# Patient Record
Sex: Male | Born: 1990 | Race: White | Hispanic: No | Marital: Married | Smoking: Never smoker
Health system: Southern US, Community
[De-identification: ages and names within clinical notes are randomized; demographics above are authoritative.]

## PROBLEM LIST (undated history)

## (undated) DIAGNOSIS — T7840XA Allergy, unspecified, initial encounter: Secondary | ICD-10-CM

## (undated) DIAGNOSIS — E785 Hyperlipidemia, unspecified: Secondary | ICD-10-CM

## (undated) HISTORY — DX: Hyperlipidemia, unspecified: E78.5

## (undated) HISTORY — DX: Allergy, unspecified, initial encounter: T78.40XA

## (undated) HISTORY — PX: WISDOM TOOTH EXTRACTION: SHX21

---

## 2012-12-08 HISTORY — PX: FUNCTIONAL ENDOSCOPIC SINUS SURGERY: SUR616

## 2017-09-28 ENCOUNTER — Encounter: Payer: Self-pay | Admitting: Family Medicine

## 2017-09-28 ENCOUNTER — Ambulatory Visit (INDEPENDENT_AMBULATORY_CARE_PROVIDER_SITE_OTHER): Payer: Managed Care, Other (non HMO) | Admitting: Family Medicine

## 2017-09-28 VITALS — BP 122/76 | HR 62 | Temp 98.7°F | Resp 12 | Ht 70.47 in | Wt 200.4 lb

## 2017-09-28 DIAGNOSIS — J309 Allergic rhinitis, unspecified: Secondary | ICD-10-CM

## 2017-09-28 DIAGNOSIS — R0683 Snoring: Secondary | ICD-10-CM

## 2017-09-28 DIAGNOSIS — Z23 Encounter for immunization: Secondary | ICD-10-CM | POA: Diagnosis not present

## 2017-09-28 DIAGNOSIS — R5382 Chronic fatigue, unspecified: Secondary | ICD-10-CM | POA: Insufficient documentation

## 2017-09-28 DIAGNOSIS — Z114 Encounter for screening for human immunodeficiency virus [HIV]: Secondary | ICD-10-CM | POA: Diagnosis not present

## 2017-09-28 LAB — COMPREHENSIVE METABOLIC PANEL
ALT: 22 U/L (ref 0–53)
AST: 18 U/L (ref 0–37)
Albumin: 4.8 g/dL (ref 3.5–5.2)
Alkaline Phosphatase: 41 U/L (ref 39–117)
BUN: 12 mg/dL (ref 6–23)
CHLORIDE: 103 meq/L (ref 96–112)
CO2: 29 mEq/L (ref 19–32)
Calcium: 10.1 mg/dL (ref 8.4–10.5)
Creatinine, Ser: 0.87 mg/dL (ref 0.40–1.50)
GFR: 111.1 mL/min (ref >60.00)
GLUCOSE: 90 mg/dL (ref 70–99)
POTASSIUM: 4 meq/L (ref 3.5–5.1)
SODIUM: 141 meq/L (ref 135–145)
Total Bilirubin: 0.6 mg/dL (ref 0.2–1.2)
Total Protein: 7.1 g/dL (ref 6.0–8.3)

## 2017-09-28 LAB — CBC
HEMATOCRIT: 47.1 % (ref 39.0–52.0)
HEMOGLOBIN: 16.1 g/dL (ref 13.0–17.0)
MCHC: 34.2 g/dL (ref 30.0–36.0)
MCV: 90.3 fl (ref 78.0–100.0)
PLATELETS: 193 10*3/uL (ref 150.0–400.0)
RBC: 5.21 Mil/uL (ref 4.22–5.81)
RDW: 12.8 % (ref 11.5–15.5)
WBC: 5.9 10*3/uL (ref 4.0–10.5)

## 2017-09-28 LAB — TSH: TSH: 1.09 u[IU]/mL (ref 0.35–4.50)

## 2017-09-28 LAB — VITAMIN D 25 HYDROXY (VIT D DEFICIENCY, FRACTURES): VITD: 27.66 ng/mL — ABNORMAL LOW (ref 30.00–100.00)

## 2017-09-28 MED ORDER — FLUTICASONE PROPIONATE 50 MCG/ACT NA SUSP
1.0000 | Freq: Two times a day (BID) | NASAL | 3 refills | Status: DC
Start: 2017-09-28 — End: 2018-01-08

## 2017-09-28 NOTE — Patient Instructions (Addendum)
A few things to remember from today's visit:   Encounter for screening for HIV - Plan: HIV antibody, Ambulatory referral to Pulmonology  Chronic fatigue - Plan: Comprehensive metabolic panel, HIV antibody, CBC, TSH, VITAMIN D 25 Hydroxy (Vit-D Deficiency, Fractures)  Snoring - Plan: Ambulatory referral to Pulmonology  Over the counter Allegra 180 mg daily.  Nasal strips at night may help with snoring. It is a common symptom associated with multiple factors: psychologic,medications, systemic illness, sleep disorders,infections, and unknown causes. Some work-up can be done to evaluate for common causes as thyroid disease,anemia,diabetes, or abnormalities in calcium,potassium,or sodium. Regular physical activity as tolerated and a healthy diet is usually might help and usually recommended for chronic fatigue.  We have ordered labs or studies at this visit.  It can take up to 1-2 weeks for results and processing. IF results require follow up or explanation, we will call you with instructions. Clinically stable results will be released to your [facility]. If you have not heard from us or cannot find your results in [facility] in 2 weeks please contact our office at [phone].  If you are not yet signed up for [facility], please consider signing up  Please be sure medication list is accurate. If a new problem present, please set up appointment sooner than planned today.

## 2017-09-28 NOTE — Progress Notes (Signed)
HPI:   Gabriel Warner is a 26 y.o. male, who is here today to establish care.  Former PCP: N/A. [place] Last preventive routine visit: A few years ago.  Chronic medical problems: Allergic rhinitis   Concerns today: Sleep apnea  For at least 4 years he has had fatigue, most of the time he doe snot feel rested when he wakes up in the morning. His wife has noted louder snoring and he is concerned about OSA. Not sure about apneic episodes. He has not identified exacerbating or alleviating factors, he feels tired every day. He denies falling asleep while driving or during meetings.   Sleeps about 7 hours.  He is also reporting problems with concentration and getting easily distracted, both she attributes to possible sleep apnea.this is not affecting his job performance, although he feels like he should be able to be more efficient if he slept better.  He denies any history of psychiatric disorder, denies anxiety or depression symptoms. History of allergic rhinitis, nasal congestion intermittently during the day. It used to be exacerbated by cats but it does not seem to be as bad as he was before. Currently he is not on treatment for allergies. He follows a healthy diet and exercises regularly.  He denies tobacco or illicit drug use, or alcohol abuse. He drinks alcohol just during weekends, about 6 beers per night and/or 3 shots of vodka.He is not specific about amount.   Review of Systems  Constitutional: Positive for fatigue. Negative for activity change, appetite change, fever and unexpected weight change.  HENT: Positive for congestion. Negative for facial swelling, mouth sores, nosebleeds, sinus pain, sore throat, trouble swallowing and voice change.   Eyes: Negative for redness and visual disturbance.  Respiratory: Negative for cough, shortness of breath and wheezing.   Cardiovascular: Negative for chest pain, palpitations and leg  swelling.  Gastrointestinal: Negative for abdominal pain, nausea and vomiting.       No changes in bowel habits.  Endocrine: Negative for cold intolerance and heat intolerance.  Genitourinary: Negative for decreased urine volume, dysuria and hematuria.  Musculoskeletal: Negative for back pain, joint swelling and myalgias.  Skin: Negative for pallor and rash.  Allergic/Immunologic: Positive for environmental allergies.  Neurological: Negative for dizziness, syncope, weakness and headaches.  Hematological: Negative for adenopathy. Does not bruise/bleed easily.  Psychiatric/Behavioral: Negative for confusion and sleep disturbance. The patient is not nervous/anxious.     No current outpatient prescriptions on file prior to visit.   No current facility-administered medications on file prior to visit.      Past Medical History:  Diagnosis Date  . Allergy    No Known Allergies  Family History  Problem Relation Age of Onset  . Hypertension Mother     Social History   Social History  . Marital status: Married    Spouse name: N/A  . Number of children: N/A  . Years of education: N/A   Social History Main Topics  . Smoking status: Never Smoker  . Smokeless tobacco: Never Used  . Alcohol use No  . Drug use: No  . Sexual activity: Yes    Partners: Female   Other Topics Concern  . None   Social History Narrative  . None    Vitals:   09/28/17 0900  BP: 122/76  Pulse: 62  Resp: 12  Temp: 98.7 F (37.1 C)  SpO2: 98%    Body mass index is 28.37  kg/m.    Physical Exam  Nursing note and vitals reviewed. Constitutional: He is oriented to person, place, and time. He appears well-developed. No distress.  HENT:  Head: Normocephalic and atraumatic.  Mouth/Throat: Oropharynx is clear and moist and mucous membranes are normal.  Mildly hypertrophic turbinates.   Eyes: Pupils are equal, round, and reactive to light. Conjunctivae and EOM are normal.  Neck: No tracheal  deviation present. No thyroid mass and no thyromegaly present.  Cardiovascular: Normal rate and regular rhythm.   No murmur heard. Pulses:      Dorsalis pedis pulses are 2+ on the right side, and 2+ on the left side.  Respiratory: Effort normal and breath sounds normal. No respiratory distress.  GI: Soft. He exhibits no mass. There is no hepatomegaly. There is no tenderness.  Musculoskeletal: He exhibits no edema or tenderness.  Lymphadenopathy:    He has no cervical adenopathy.       Right: No supraclavicular adenopathy present.       Left: No supraclavicular adenopathy present.  Neurological: He is alert and oriented to person, place, and time. He has normal strength. Coordination and gait normal.  Skin: Skin is warm. No rash noted. No erythema.  Psychiatric: His mood appears anxious. Cognition and memory are normal.  Well groomed, good eye contact.      ASSESSMENT AND PLAN:   [name] was seen today for establish care.  Diagnoses and all orders for this visit:  Chronic fatigue  We discussed possible etiologies: Systemic illness, immunologic,endocrinology,sleep disorder, psychiatric/psychologic, infectious,medications side effects, and idiopathic. Examination today does not suggest a serious process. Healthy diet and regular physical activity to continue. Recommend decreasing alcohol intake. Further recommendations will be given according to lab results.  -     Comprehensive metabolic panel -     HIV antibody -     CBC -     TSH -     VITAMIN D 25 Hydroxy (Vit-D Deficiency, Fractures)  Encounter for screening for HIV -     HIV antibody -     Ambulatory referral to Pulmonology  Snoring  Possible causes of louder snoring discussed. Explained that snoring is not always abnormal. Nasal strips and position changes may help.  -     Ambulatory referral to Pulmonology  Allergic rhinitis, unspecified seasonality, unspecified trigger  This could be contributing to  fatigue and snoring. Recommend trying OTC antihistaminic. Flonase nasal spray may also help. F/U as needed.  -     fluticasone (FLONASE) 50 MCG/ACT nasal spray; Place 1 spray into both nostrils 2 (two) times daily.  Need for influenza vaccination -     Flu Vaccine QUAD 36+ mos IM     Gabriel Diekman G. [place], MD  [facility]. [place] [facility].

## 2017-09-29 LAB — HIV ANTIBODY (ROUTINE TESTING W REFLEX): HIV: NONREACTIVE

## 2017-11-09 ENCOUNTER — Institutional Professional Consult (permissible substitution): Payer: Managed Care, Other (non HMO) | Admitting: Pulmonary Disease

## 2017-12-18 ENCOUNTER — Institutional Professional Consult (permissible substitution): Payer: Managed Care, Other (non HMO) | Admitting: Pulmonary Disease

## 2018-01-08 ENCOUNTER — Encounter: Payer: Self-pay | Admitting: Internal Medicine

## 2018-01-08 ENCOUNTER — Ambulatory Visit (INDEPENDENT_AMBULATORY_CARE_PROVIDER_SITE_OTHER): Payer: Managed Care, Other (non HMO) | Admitting: Internal Medicine

## 2018-01-08 VITALS — BP 122/80 | HR 57 | Ht 69.0 in | Wt 200.0 lb

## 2018-01-08 DIAGNOSIS — R5382 Chronic fatigue, unspecified: Secondary | ICD-10-CM | POA: Diagnosis not present

## 2018-01-08 DIAGNOSIS — G4733 Obstructive sleep apnea (adult) (pediatric): Secondary | ICD-10-CM | POA: Diagnosis not present

## 2018-01-08 NOTE — Progress Notes (Signed)
01[birth year]18-27 year old male never smoker for sleep evaluation. Medical problem list includes allergic rhinitis, chronic fatigue Epworth score 8 Father has OSA/CPAP. Told he snores.  Persistent waking unrefreshed with daytime tiredness.  3 cups of coffee through the day.  Occasional melatonin.  Not aware of movement parasomnias.  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  Not aware of heart or lung problems.  Previous endoscopic ENT sinus surgery. He did not do well with an unfettered oral appliance P purchased on his own.  Prior to Admission medications   Medication Sig Start Date End Date Taking? Authorizing Provider  CALCIUM PO Take by mouth. Take 1 tablet 3 times a week   Yes [provider]  Cholecalciferol (VITAMIN D) 2000 units CAPS Take 1 capsule by mouth daily.   Yes [provider]  Omega-3 Fatty Acids (FISH OIL PO) Take by mouth. 1 tab 3 times a week   Yes [provider]   Past Medical History:  Diagnosis Date  . Allergy     Family History  Problem Relation Age of Onset  . Hypertension Mother    Social History   Socioeconomic History  . Marital status: Married    Spouse name: Not on file  . Number of children: Not on file  . Years of education: Not on file  . Highest education level: Not on file  Social Needs  . Financial resource strain: Not on file  . Food insecurity - worry: Not on file  . Food insecurity - inability: Not on file  . Transportation needs - medical: Not on file  . Transportation needs - non-medical: Not on file  Occupational History  . Not on file  Tobacco Use  . Smoking status: Never Smoker  . Smokeless tobacco: Never Used  Substance and Sexual Activity  . Alcohol use: No    Comment: Social-mix of beer,wine, or liquor  . Drug use: No  . Sexual activity: Yes    Partners: Female  Other Topics Concern  . Not on file  Social History Narrative  . Not on file   ROS-see HPI   + = positive Constitutional:    weight loss,  night sweats, fevers, chills, fatigue, lassitude. HEENT:    headaches, difficulty swallowing, tooth/dental problems, sore throat,       sneezing, itching, ear ache, nasal congestion, post nasal drip, snoring CV:    chest pain, orthopnea, PND, swelling in lower extremities, anasarca,                                                     dizziness, palpitations Resp:   shortness of breath with exertion or at rest.                productive cough,   non-productive cough, coughing up of blood.              change in color of mucus.  wheezing.   Skin:    rash or lesions. GI:  No-   heartburn, indigestion, abdominal pain, nausea, vomiting, diarrhea,                 change in bowel habits, loss of appetite GU: dysuria, change in color of urine, no urgency or frequency.   flank pain. MS:   joint pain, stiffness, decreased range of motion, back pain. Neuro-  nothing unusual Psych:  change in mood or affect.  depression or anxiety.   memory loss.  OBJ- Physical Exam General- Alert, Oriented, Affect-appropriate, Distress- none acute, + medium build Skin- rash-none, lesions- none, excoriation- none Lymphadenopathy- none Head- atraumatic            Eyes- Gross vision intact, PERRLA, conjunctivae and secretions clear            Ears- Hearing, canals-normal            Nose- Clear, no-Septal dev, mucus, polyps, erosion, perforation             Throat- Mallampati II , mucosa clear , drainage- none, tonsils-absent, + own teeth Neck- flexible , trachea midline, no stridor , thyroid nl, carotid no bruit Chest - symmetrical excursion , unlabored           Heart/CV- RRR , no murmur , no gallop  , no rub, nl s1 s2                           - JVD- none , edema- none, stasis changes- none, varices- none           Lung- clear to P&A, wheeze- none, cough- none , dullness-none, rub- none           Chest wall-  Abd-  Br/ Gen/ Rectal- Not done, not indicated Extrem- cyanosis- none, clubbing, none, atrophy- none,  strength- nl Neuro- grossly intact to observation

## 2018-01-08 NOTE — Assessment & Plan Note (Signed)
Suspect the problem may be from obstructive sleep apnea.  Basic sleep hygiene and the mechanics of obstructive sleep apnea were discussed. Plan-schedule sleep study.  He will call for results.  We had talked about CPAP and oral appliances as treatment possibilities.

## 2018-01-08 NOTE — Patient Instructions (Signed)
Order- please schedule unattended home sleep test   Dx OSA   Please call me about 2 weeks after your sleep test to ask for results and recommendations. If appropriate, we may be able to start treatment before I see you next.

## 2018-01-15 ENCOUNTER — Institutional Professional Consult (permissible substitution): Payer: Managed Care, Other (non HMO) | Admitting: Pulmonary Disease

## 2018-01-31 DIAGNOSIS — G4733 Obstructive sleep apnea (adult) (pediatric): Secondary | ICD-10-CM | POA: Diagnosis not present

## 2018-02-01 DIAGNOSIS — G4733 Obstructive sleep apnea (adult) (pediatric): Secondary | ICD-10-CM | POA: Diagnosis not present

## 2018-02-02 ENCOUNTER — Other Ambulatory Visit: Payer: Self-pay | Admitting: *Deleted

## 2018-02-02 DIAGNOSIS — G4733 Obstructive sleep apnea (adult) (pediatric): Secondary | ICD-10-CM

## 2018-03-09 ENCOUNTER — Telehealth: Payer: Self-pay | Admitting: Internal Medicine

## 2018-03-09 NOTE — Telephone Encounter (Signed)
Called and spoke with patient. He states he was returning call from sleep results. Advised patient of the results. Per result note patient could keep or cancel his appointment. Patient has chosen to cancel it. Nothing further needed at this time.

## 2018-04-09 ENCOUNTER — Ambulatory Visit: Payer: Managed Care, Other (non HMO) | Admitting: Internal Medicine

## 2018-05-31 ENCOUNTER — Telehealth: Payer: Self-pay | Admitting: Internal Medicine

## 2018-05-31 DIAGNOSIS — G4733 Obstructive sleep apnea (adult) (pediatric): Secondary | ICD-10-CM

## 2018-05-31 DIAGNOSIS — R5382 Chronic fatigue, unspecified: Secondary | ICD-10-CM

## 2018-05-31 NOTE — Telephone Encounter (Signed)
Called patient, unable to reach. Unable to leave voice mail due to box being full.

## 2018-06-02 NOTE — Telephone Encounter (Signed)
Spoke with pt, he states he did a HST and it showed that he did not have sleep apnea but he still wanted the device. CY please advise on referral to a dentist to fit for an oral device/mouth guard?

## 2018-06-04 NOTE — Telephone Encounter (Signed)
Returned call to Patient.  Explained that CY is out of the office, but he will get his message Monday 06/07/18,and someone will return his call, with CY recommendations.

## 2018-06-04 NOTE — Telephone Encounter (Signed)
ATC patient to let him know that CY is out of the office until 06/07/18; however unable to leave a message at this time-mailbox is full

## 2018-06-07 NOTE — Telephone Encounter (Signed)
Ok to refer to Dr Althea GrimmerMark Katz, DDS orthodontist to consider oral appliance to treat snoring and fatigue.

## 2018-06-07 NOTE — Telephone Encounter (Signed)
Referral has been placed and pt has been made aware. Nothing further needed.

## 2018-09-27 ENCOUNTER — Encounter: Payer: Self-pay | Admitting: Family Medicine

## 2018-09-27 ENCOUNTER — Ambulatory Visit (INDEPENDENT_AMBULATORY_CARE_PROVIDER_SITE_OTHER): Payer: Managed Care, Other (non HMO) | Admitting: Family Medicine

## 2018-09-27 VITALS — BP 110/70 | HR 74 | Temp 98.7°F | Resp 12 | Ht 70.0 in | Wt 212.0 lb

## 2018-09-27 DIAGNOSIS — Z0189 Encounter for other specified special examinations: Secondary | ICD-10-CM | POA: Diagnosis not present

## 2018-09-27 DIAGNOSIS — Z131 Encounter for screening for diabetes mellitus: Secondary | ICD-10-CM

## 2018-09-27 DIAGNOSIS — Z0000 Encounter for general adult medical examination without abnormal findings: Secondary | ICD-10-CM

## 2018-09-27 DIAGNOSIS — Z13220 Encounter for screening for lipoid disorders: Secondary | ICD-10-CM

## 2018-09-27 DIAGNOSIS — Z008 Encounter for other general examination: Secondary | ICD-10-CM

## 2018-09-27 LAB — LIPID PANEL
Cholesterol: 245 mg/dL — ABNORMAL HIGH (ref 0–200)
HDL: 49.8 mg/dL (ref >39.00)
NONHDL: 194.75
TRIGLYCERIDES: 291 mg/dL — AB (ref 0.0–149.0)
Total CHOL/HDL Ratio: 5
VLDL: 58.2 mg/dL — ABNORMAL HIGH (ref 0.0–40.0)

## 2018-09-27 LAB — BASIC METABOLIC PANEL
BUN: 16 mg/dL (ref 6–23)
CALCIUM: 9.7 mg/dL (ref 8.4–10.5)
CO2: 30 meq/L (ref 19–32)
Chloride: 101 mEq/L (ref 96–112)
Creatinine, Ser: 1.03 mg/dL (ref 0.40–1.50)
GFR: 90.79 mL/min (ref >60.00)
Glucose, Bld: 100 mg/dL — ABNORMAL HIGH (ref 70–99)
Potassium: 3.9 mEq/L (ref 3.5–5.1)
SODIUM: 140 meq/L (ref 135–145)

## 2018-09-27 LAB — LDL CHOLESTEROL, DIRECT: Direct LDL: 162 mg/dL

## 2018-09-27 NOTE — Patient Instructions (Addendum)
A few things to remember from today's visit:   Routine general medical examination at a health care facility  Screening for lipid disorders - Plan: Lipid panel  Diabetes mellitus screening - Plan: Basic metabolic panel  Encounter for biometric screening - Plan: Basic metabolic panel, Lipid panel  At least 150 minutes of moderate exercise per week, daily brisk walking for 15-30 min is a good exercise option. Healthy diet low in saturated (animal) fats and sweets and consisting of fresh fruits and vegetables, lean meats such as fish and white chicken and whole grains.  - Vaccines:  Tdap vaccine every 10 years.  Shingles vaccine recommended at age 8, could be given after 29 years of age but not sure about insurance coverage.  Pneumonia vaccines:  Prevnar 13 at 65 and Pneumovax at 57.   -Screening recommendations for low/normal risk males:  Screening for diabetes at age 35 and every 3 years. Earlier screening if cardiovascular risk factors.   Lipid screening at 35 and every 3 years. Screening starts in younger males with cardiovascular risk factors.  Colon cancer screening at age 71 and until age 67.  Prostate cancer screening: some controversy, starts usually at 50: Rectal exam and PSA.  Aortic Abdominal Aneurism once between 67 and 65 years old if ever smoker.  Also recommended:  1. Dental visit- Brush and floss your teeth twice daily; visit your dentist twice a year. 2. Eye doctor- Get an eye exam at least every 2 years. 3. Helmet use- Always wear a helmet when riding a bicycle, motorcycle, rollerblading or skateboarding. 4. Safe sex- If you may be exposed to sexually transmitted infections, use a condom. 5. Seat belts- Seat belts can save your live; always wear one. 6. Smoke/Carbon Monoxide detectors- These detectors need to be installed on the appropriate level of your home. Replace batteries at least once a year. 7. Skin cancer- When out in the sun please cover up and  use sunscreen 15 SPF or higher. 8. Violence- If anyone is threatening or hurting you, please tell your healthcare provider.  9. Drink alcohol in moderation- Limit alcohol intake to one drink or less per day. Never drink and drive.   Please be sure medication list is accurate. If a new problem present, please set up appointment sooner than planned today.

## 2018-09-27 NOTE — Progress Notes (Signed)
HPI:  [name] is a 27 y.o.male here today for his routine physical examination.  Last CPE: Many years ago. He lives with his wife, who is expecting their first child.  She is due in 2 weeks.  Regular exercise 3 or more times per week: He runs about 3 times per week. Following a healthy diet: Yes   Chronic medical problems: Fatigue. Sleep study was negative for OSA. He was evaluated by orthodontist but cannot afford mild device to help with sleep.  Hx of STD's: Negative  Immunization History  Administered Date(s) Administered  . Influenza,inj,Quad PF,6+ Mos 09/28/2017  . Influenza-Unspecified 09/13/2018  . Tdap 07/09/2018    -Denies high alcohol intake, tobacco use, or Hx of illicit drug use. He thinks he needs to have biometric exam done in order to get gift certificate from his insurance company.  Concerns today: Left testicular "bump." About a month ago he noted a small lesion on his left scrotum. He wants to know if it is a wart or something else he should be worried about.  He is not sure if lesion is growing, it is not tender. He has not tried OTC treatments.  No history of trauma. He has not noted dysuria, hematuria, or urine frequency. No urethral discharge.  Review of Systems  Constitutional: Positive for fatigue. Negative for activity change, appetite change, fever and unexpected weight change.  HENT: Negative for dental problem, nosebleeds, sore throat, trouble swallowing and voice change.   Eyes: Negative for redness and visual disturbance.  Respiratory: Negative for cough, shortness of breath and wheezing.   Cardiovascular: Negative for chest pain, palpitations and leg swelling.  Gastrointestinal: Negative for abdominal pain, blood in stool, nausea and vomiting.       No changes in bowel habits.  Endocrine: Negative for cold intolerance, heat intolerance, polydipsia, polyphagia and polyuria.  Genitourinary: Negative for decreased urine  volume, dysuria, genital sores, hematuria and testicular pain.  Musculoskeletal: Negative for arthralgias, back pain, joint swelling and myalgias.  Skin: Negative for color change and rash.  Allergic/Immunologic: Positive for environmental allergies.  Neurological: Negative for dizziness, syncope, weakness and headaches.  Hematological: Negative for adenopathy. Does not bruise/bleed easily.  Psychiatric/Behavioral: Negative for confusion and sleep disturbance. The patient is nervous/anxious.      Current Outpatient Medications on File Prior to Visit  Medication Sig Dispense Refill  . CALCIUM PO Take by mouth. Take 1 tablet 3 times a week    . Cholecalciferol (VITAMIN D) 2000 units CAPS Take 1 capsule by mouth daily.    . Omega-3 Fatty Acids (FISH OIL PO) Take by mouth. 1 tab 3 times a week     No current facility-administered medications on file prior to visit.      Past Medical History:  Diagnosis Date  . Allergy     Past Surgical History:  Procedure Laterality Date  . FUNCTIONAL ENDOSCOPIC SINUS SURGERY  2014    No Known Allergies  Family History  Problem Relation Age of Onset  . Hypertension Mother     Social History   Socioeconomic History  . Marital status: Married    Spouse name: Not on file  . Number of children: Not on file  . Years of education: Not on file  . Highest education level: Not on file  Occupational History  . Not on file  Social Needs  . Financial resource strain: Not on file  . Food insecurity:    Worry: Not on  file    Inability: Not on file  . Transportation needs:    Medical: Not on file    Non-medical: Not on file  Tobacco Use  . Smoking status: Never Smoker  . Smokeless tobacco: Never Used  Substance and Sexual Activity  . Alcohol use: No    Comment: Social-mix of beer,wine, or liquor  . Drug use: No  . Sexual activity: Yes    Partners: Female  Lifestyle  . Physical activity:    Days per week: Not on file    Minutes per  session: Not on file  . Stress: Not on file  Relationships  . Social connections:    Talks on phone: Not on file    Gets together: Not on file    Attends religious service: Not on file    Active member of club or organization: Not on file    Attends meetings of clubs or organizations: Not on file    Relationship status: Not on file  Other Topics Concern  . Not on file  Social History Narrative  . Not on file     Vitals:   09/27/18 0834  BP: 110/70  Pulse: 74  Resp: 12  Temp: 98.7 F (37.1 C)  SpO2: 98%   Body mass index is 30.42 kg/m.   Wt Readings from Last 3 Encounters:  09/27/18 212 lb (96.2 kg)  01/08/18 200 lb (90.7 kg)  09/28/17 200 lb 6 oz (90.9 kg)    Physical Exam  Nursing note and vitals reviewed. Constitutional: He is oriented to person, place, and time. He appears well-developed. No distress.  HENT:  Head: Atraumatic.  Right Ear: Tympanic membrane, external ear and ear canal normal.  Left Ear: Tympanic membrane, external ear and ear canal normal.  Mouth/Throat: Oropharynx is clear and moist and mucous membranes are normal.  Eyes: Pupils are equal, round, and reactive to light. Conjunctivae and EOM are normal.  Neck: Normal range of motion. No tracheal deviation present. No thyromegaly present.  Cardiovascular: Normal rate and regular rhythm.  No murmur heard. Pulses:      Dorsalis pedis pulses are 2+ on the right side, and 2+ on the left side.  Respiratory: Effort normal and breath sounds normal. No respiratory distress.  GI: Soft. He exhibits no mass. There is no hepatomegaly. There is no tenderness. Hernia confirmed negative in the right inguinal area and confirmed negative in the left inguinal area.  Genitourinary: Right testis shows no mass, no swelling and no tenderness. Left testis shows no mass, no swelling and no tenderness.  Genitourinary Comments: Area of concern on posterior aspect of left scrotum: 1 mm whitish race skin lesion. It is not  tender or erythematous. Define borders,smooth. It is superficial and mobile.  Musculoskeletal: He exhibits no edema or tenderness.  No major deformities appreciated and no signs of synovitis.  Lymphadenopathy:    He has no cervical adenopathy.       Right: No inguinal and no supraclavicular adenopathy present.       Left: No inguinal and no supraclavicular adenopathy present.  Neurological: He is alert and oriented to person, place, and time. He has normal strength. No cranial nerve deficit or sensory deficit. Coordination and gait normal.  Reflex Scores:      Bicep reflexes are 2+ on the right side and 2+ on the left side.      Patellar reflexes are 2+ on the right side and 2+ on the left side. Skin: Skin is warm. No  erythema.  Psychiatric: He has a normal mood and affect. Cognition and memory are normal.     ASSESSMENT AND PLAN:   Mr. Terryl was seen today for annual exam.  Orders Placed This Encounter  Procedures  . Basic metabolic panel  . Lipid panel   Lab Results  Component Value Date   CREATININE 1.03 09/27/2018   BUN 16 09/27/2018   NA 140 09/27/2018   K 3.9 09/27/2018   CL 101 09/27/2018   CO2 30 09/27/2018   Lab Results  Component Value Date   CHOL 245 (H) 09/27/2018   HDL 49.80 09/27/2018   LDLDIRECT 162.0 09/27/2018   TRIG 291.0 (H) 09/27/2018   CHOLHDL 5 09/27/2018     Routine general medical examination at a health care facility We discussed the importance of regular physical activity and healthy diet for prevention of chronic illness and/or complications. Preventive guidelines reviewed. Vaccination up-to-date.  Next CPE in a year.  Screening for lipid disorders -     Lipid panel  Diabetes mellitus screening -     Basic metabolic panel  Encounter for biometric screening If he needs a form completed, he was instructed to drop it off.  -     Basic metabolic panel -     Lipid panel  He was reassured about lesion on left scrotum.  It is very  small,superficial, and not suspicious. Instructed to continue monitoring for changes and follow-up as needed.    Return in 1 year (on 09/28/2019).    Gabriel Keown G. [place], MD  [facility]. [place] office.

## 2018-09-30 ENCOUNTER — Encounter: Payer: Self-pay | Admitting: Family Medicine

## 2019-11-23 ENCOUNTER — Other Ambulatory Visit: Payer: Self-pay

## 2019-11-23 ENCOUNTER — Ambulatory Visit: Payer: Managed Care, Other (non HMO) | Attending: Internal Medicine

## 2019-11-23 DIAGNOSIS — Z20822 Contact with and (suspected) exposure to covid-19: Secondary | ICD-10-CM

## 2019-11-25 LAB — NOVEL CORONAVIRUS, NAA: SARS-CoV-2, NAA: NOT DETECTED

## 2020-07-20 ENCOUNTER — Encounter: Payer: Managed Care, Other (non HMO) | Admitting: Family Medicine

## 2020-07-27 ENCOUNTER — Other Ambulatory Visit: Payer: Self-pay

## 2020-07-27 ENCOUNTER — Encounter: Payer: Self-pay | Admitting: Family Medicine

## 2020-07-27 ENCOUNTER — Ambulatory Visit (INDEPENDENT_AMBULATORY_CARE_PROVIDER_SITE_OTHER): Payer: Managed Care, Other (non HMO) | Admitting: Family Medicine

## 2020-07-27 VITALS — BP 126/70 | HR 79 | Resp 12 | Ht 70.0 in | Wt 209.2 lb

## 2020-07-27 DIAGNOSIS — E782 Mixed hyperlipidemia: Secondary | ICD-10-CM

## 2020-07-27 DIAGNOSIS — Z1329 Encounter for screening for other suspected endocrine disorder: Secondary | ICD-10-CM | POA: Diagnosis not present

## 2020-07-27 DIAGNOSIS — Z1159 Encounter for screening for other viral diseases: Secondary | ICD-10-CM | POA: Diagnosis not present

## 2020-07-27 DIAGNOSIS — Z0000 Encounter for general adult medical examination without abnormal findings: Secondary | ICD-10-CM

## 2020-07-27 DIAGNOSIS — L509 Urticaria, unspecified: Secondary | ICD-10-CM

## 2020-07-27 DIAGNOSIS — Z130 Encounter for screening for diseases of the blood and blood-forming organs and certain disorders involving the immune mechanism: Secondary | ICD-10-CM

## 2020-07-27 DIAGNOSIS — Z13228 Encounter for screening for other metabolic disorders: Secondary | ICD-10-CM

## 2020-07-27 NOTE — Patient Instructions (Addendum)
A few things to remember from today's visit:  Encounter for HCV screening test for low risk patient  Urticaria - Plan: Ambulatory referral to Immunology  Routine general medical examination at a health care facility  Screening for endocrine, metabolic and immunity disorder  Hyperlipidemia, mixed  At least 150 minutes of moderate exercise per week, daily brisk walking for 15-30 min is a good exercise option. Healthy diet low in saturated (animal) fats and sweets and consisting of fresh fruits and vegetables, lean meats such as fish and white chicken and whole grains.  - Vaccines:  Tdap vaccine every 10 years.  Shingles vaccine recommended at age 43, could be given after 31 years of age but not sure about insurance coverage.  Pneumonia vaccines: Pneumovax at 665  -Screening recommendations for low/normal risk males:  Screening for diabetes at age 57 and every 3 years. Earlier screening if cardiovascular risk factors.   Lipid screening at 35 and every 3 years. Screening starts in younger males with cardiovascular risk factors.N/A  Colon cancer screening is now at age 75 but your insurance may not cover until age 1 .screening is recommended age 17.  Prostate cancer screening: some controversy, starts usually at 50: Rectal exam and PSA.  Aortic Abdominal Aneurism once between 14 and 37 years old if ever smoker.  Also recommended:  1. Dental visit- Brush and floss your teeth twice daily; visit your dentist twice a year. 2. Eye doctor- Get an eye exam at least every 2 years. 3. Helmet use- Always wear a helmet when riding a bicycle, motorcycle, rollerblading or skateboarding. 4. Safe sex- If you may be exposed to sexually transmitted infections, use a condom. 5. Seat belts- Seat belts can save your live; always wear one. 6. Smoke/Carbon Monoxide detectors- These detectors need to be installed on the appropriate level of your home. Replace batteries at least once a year. 7. Skin  cancer- When out in the sun please cover up and use sunscreen 15 SPF or higher. 8. Violence- If anyone is threatening or hurting you, please tell your healthcare provider.  9. Drink alcohol in moderation- Limit alcohol intake to one drink or less per day. Never drink and drive.     Please be sure medication list is accurate. If a new problem present, please set up appointment sooner than planned today.

## 2020-07-27 NOTE — Progress Notes (Signed)
HPI:  [name] is a 29 y.o.male here today for his routine physical examination.  Last CPE: 09/27/2018. He lives with his wife and 2 children.  Regular exercise 3 or more times per week:Running 2 times per week for 20-30 min. Following a healthful diet:For the most part. He cooks at home.  Chronic medical problems: Seasonal allergies and hyperlipidemia.  Immunization History  Administered Date(s) Administered  . Influenza, Quadrivalent, Recombinant, Inj, Pf 10/22/2019  . Influenza,inj,Quad PF,6+ Mos 09/28/2017  . Influenza-Unspecified 09/13/2018  . PFIZER SARS-COV-2 Vaccination 03/06/2020, 03/24/2020  . Tdap 07/09/2018   -Hep C screening:Never.  Negative for high alcohol intake, tobacco use, or Hx of illicit drug use. Drinks beer weekends, about 4 beers.  -Concerns and/or follow up today:   "Weird hives" intermittent for a 1-2 weeks. He has not identified exacerbating factors. Negative for new medication, new soaps, new skin products, or detergents.  He thought initially it was beer but he has drunken the same beer for a while. Negative for associated cough, stridor, dysphagia, or facial edema. Lesions are usually on back and lower extremities. Benadryl helps. Last episode a few days ago. His brother has similar problem.  HLD: Currently he is on nonpharmacologic treatment. Lab Results  Component Value Date   CHOL 245 (H) 09/27/2018   HDL 49.80 09/27/2018   LDLDIRECT 162.0 09/27/2018   TRIG 291.0 (H) 09/27/2018   CHOLHDL 5 09/27/2018   Review of Systems  Constitutional: Negative for activity change, appetite change, fatigue, fever and unexpected weight change.  HENT: Negative for dental problem, nosebleeds and sore throat.   Eyes: Negative for redness and visual disturbance.  Respiratory: Negative for shortness of breath and wheezing.   Cardiovascular: Negative for chest pain, palpitations and leg swelling.  Gastrointestinal: Negative for  abdominal pain, blood in stool, nausea and vomiting.  Endocrine: Negative for cold intolerance, heat intolerance, polydipsia, polyphagia and polyuria.  Genitourinary: Negative for decreased urine volume, dysuria, genital sores, hematuria and testicular pain.  Musculoskeletal: Negative for gait problem and myalgias.  Skin: Negative for color change and rash.  Allergic/Immunologic: Positive for environmental allergies.  Neurological: Negative for syncope, weakness and headaches.  Hematological: Negative for adenopathy. Does not bruise/bleed easily.  Psychiatric/Behavioral: Negative for confusion. The patient is not nervous/anxious.    Current Outpatient Medications on File Prior to Visit  Medication Sig Dispense Refill  . CALCIUM PO Take by mouth. Take 1 tablet 3 times a week    . Cholecalciferol (VITAMIN D) 2000 units CAPS Take 1 capsule by mouth daily.    . Omega-3 Fatty Acids (FISH OIL PO) Take by mouth. 1 tab 3 times a week     No current facility-administered medications on file prior to visit.   Past Medical History:  Diagnosis Date  . Allergy   . Hyperlipidemia     Past Surgical History:  Procedure Laterality Date  . FUNCTIONAL ENDOSCOPIC SINUS SURGERY  2014   No Known Allergies  Family History  Problem Relation Age of Onset  . Hypertension Mother     Social History   Socioeconomic History  . Marital status: Married    Spouse name: Not on file  . Number of children: Not on file  . Years of education: Not on file  . Highest education level: Not on file  Occupational History  . Not on file  Tobacco Use  . Smoking status: Never Smoker  . Smokeless tobacco: Never Used  Vaping Use  . Vaping Use:  Never used  Substance and Sexual Activity  . Alcohol use: No    Comment: Social-mix of beer,wine, or liquor  . Drug use: No  . Sexual activity: Yes    Partners: Female  Other Topics Concern  . Not on file  Social History Narrative  . Not on file   Social  Determinants of Health   Financial Resource Strain:   . Difficulty of Paying Living Expenses: Not on file  Food Insecurity:   . Worried About Programme researcher, broadcasting/film/videounning Out of Food in the Last Year: Not on file  . Ran Out of Food in the Last Year: Not on file  Transportation Needs:   . Lack of Transportation (Medical): Not on file  . Lack of Transportation (Non-Medical): Not on file  Physical Activity:   . Days of Exercise per Week: Not on file  . Minutes of Exercise per Session: Not on file  Stress:   . Feeling of Stress : Not on file  Social Connections:   . Frequency of Communication with Friends and Family: Not on file  . Frequency of Social Gatherings with Friends and Family: Not on file  . Attends Religious Services: Not on file  . Active Member of Clubs or Organizations: Not on file  . Attends BankerClub or Organization Meetings: Not on file  . Marital Status: Not on file   Vitals:   07/27/20 1158  BP: 126/70  Pulse: 79  Resp: 12  SpO2: 97%   Body mass index is 30.02 kg/m.  Wt Readings from Last 3 Encounters:  07/27/20 209 lb 4 oz (94.9 kg)  09/27/18 212 lb (96.2 kg)  01/08/18 200 lb (90.7 kg)   Physical Exam Vitals and nursing note reviewed.  Constitutional:      General: He is not in acute distress.    Appearance: He is well-developed.  HENT:     Head: Normocephalic and atraumatic.     Right Ear: Tympanic membrane, ear canal and external ear normal.     Left Ear: Tympanic membrane, ear canal and external ear normal.     Mouth/Throat:     Mouth: Mucous membranes are moist.     Pharynx: Oropharynx is clear.  Eyes:     Extraocular Movements: Extraocular movements intact.     Conjunctiva/sclera: Conjunctivae normal.     Pupils: Pupils are equal, round, and reactive to light.  Neck:     Thyroid: No thyromegaly.     Trachea: No tracheal deviation.  Cardiovascular:     Rate and Rhythm: Normal rate and regular rhythm.     Pulses:          Dorsalis pedis pulses are 2+ on the right  side and 2+ on the left side.     Heart sounds: No murmur heard.   Pulmonary:     Effort: Pulmonary effort is normal. No respiratory distress.     Breath sounds: Normal breath sounds.  Abdominal:     Palpations: Abdomen is soft. There is no hepatomegaly or mass.     Tenderness: There is no abdominal tenderness.  Genitourinary:    Comments: No concerns. Musculoskeletal:        General: No tenderness.     Cervical back: Normal range of motion.     Comments: No major deformities appreciated and no signs of synovitis.  Lymphadenopathy:     Cervical: No cervical adenopathy.     Upper Body:     Right upper body: No supraclavicular adenopathy.  Left upper body: No supraclavicular adenopathy.  Skin:    General: Skin is warm.     Findings: No erythema or rash.  Neurological:     General: No focal deficit present.     Mental Status: He is alert and oriented to person, place, and time.     Cranial Nerves: No cranial nerve deficit.     Sensory: Sensation is intact. No sensory deficit.     Motor: No weakness or tremor.     Coordination: Coordination normal.     Gait: Gait normal.     Deep Tendon Reflexes:     Reflex Scores:      Bicep reflexes are 2+ on the right side and 2+ on the left side.      Patellar reflexes are 2+ on the right side and 2+ on the left side. Psychiatric:        Mood and Affect: Mood and affect normal.        Cognition and Memory: Cognition normal.   ASSESSMENT AND PLAN:  Mr.Gabriel Warner was seen today for annual exam.  Diagnoses and all orders for this visit:  Orders Placed This Encounter  Procedures  . BASIC METABOLIC PANEL WITH GFR  . Hepatitis C antibody  . Lipid panel  . Ambulatory referral to Immunology     Routine general medical examination at a health care facility We discussed the importance of regular physical activity and healthy diet for prevention of chronic illness and/or complications. Preventive guidelines reviewed. Vaccination  up-to-date  Next CPE in a year.  Encounter for HCV screening test for low risk patient -     Hepatitis C antibody; Future  Urticaria We discussed possible etiologies. Recommend cetirizine 10 mg daily. Appointment with immunologist will be arranged. Instructed about warning signs.  Screening for endocrine, metabolic and immunity disorder -     BASIC METABOLIC PANEL WITH GFR; Future  Hyperlipidemia, mixed For now continue nonpharmacologic treatment. Further recommendation will be given according to lipid panel results. He is not fasting today, so we will arrange lab appointment.  Return in 1 year (on 07/27/2021) for CPE. Needs fasting labs appt. next week..   Gabriel Warner G. [place], MD  [facility]. [place] office.  A few things to remember from today's visit:  Encounter for HCV screening test for low risk patient  Urticaria - Plan: Ambulatory referral to Immunology  Routine general medical examination at a health care facility  Screening for endocrine, metabolic and immunity disorder  Hyperlipidemia, mixed  At least 150 minutes of moderate exercise per week, daily brisk walking for 15-30 min is a good exercise option. Healthy diet low in saturated (animal) fats and sweets and consisting of fresh fruits and vegetables, lean meats such as fish and white chicken and whole grains.  - Vaccines:  Tdap vaccine every 10 years.  Shingles vaccine recommended at age 62, could be given after 31 years of age but not sure about insurance coverage.  Pneumonia vaccines: Pneumovax at 665  -Screening recommendations for low/normal risk males:  Screening for diabetes at age 1 and every 3 years. Earlier screening if cardiovascular risk factors.   Lipid screening at 35 and every 3 years. Screening starts in younger males with cardiovascular risk factors.N/A  Colon cancer screening is now at age 69 but your insurance may not cover until age 39 .screening is recommended age  83.  Prostate cancer screening: some controversy, starts usually at 50: Rectal exam and PSA.  Aortic Abdominal Aneurism once between  38 and 40 years old if ever smoker.  Also recommended:  1. Dental visit- Brush and floss your teeth twice daily; visit your dentist twice a year. 2. Eye doctor- Get an eye exam at least every 2 years. 3. Helmet use- Always wear a helmet when riding a bicycle, motorcycle, rollerblading or skateboarding. 4. Safe sex- If you may be exposed to sexually transmitted infections, use a condom. 5. Seat belts- Seat belts can save your live; always wear one. 6. Smoke/Carbon Monoxide detectors- These detectors need to be installed on the appropriate level of your home. Replace batteries at least once a year. 7. Skin cancer- When out in the sun please cover up and use sunscreen 15 SPF or higher. 8. Violence- If anyone is threatening or hurting you, please tell your healthcare provider.  9. Drink alcohol in moderation- Limit alcohol intake to one drink or less per day. Never drink and drive.     Please be sure medication list is accurate. If a new problem present, please set up appointment sooner than planned today.

## 2020-08-10 ENCOUNTER — Other Ambulatory Visit (INDEPENDENT_AMBULATORY_CARE_PROVIDER_SITE_OTHER): Payer: Managed Care, Other (non HMO)

## 2020-08-10 ENCOUNTER — Other Ambulatory Visit: Payer: Self-pay

## 2020-08-10 DIAGNOSIS — Z130 Encounter for screening for diseases of the blood and blood-forming organs and certain disorders involving the immune mechanism: Secondary | ICD-10-CM

## 2020-08-10 DIAGNOSIS — E782 Mixed hyperlipidemia: Secondary | ICD-10-CM | POA: Diagnosis not present

## 2020-08-10 DIAGNOSIS — Z1159 Encounter for screening for other viral diseases: Secondary | ICD-10-CM

## 2020-08-14 LAB — LIPID PANEL
Cholesterol: 218 mg/dL — ABNORMAL HIGH (ref <200)
HDL: 48 mg/dL (ref >=40)
LDL Cholesterol (Calc): 143 mg/dL (calc) — ABNORMAL HIGH
Non-HDL Cholesterol (Calc): 170 mg/dL (calc) — ABNORMAL HIGH (ref <130)
Total CHOL/HDL Ratio: 4.5 (calc) (ref <5.0)
Triglycerides: 145 mg/dL (ref <150)

## 2020-08-14 LAB — BASIC METABOLIC PANEL WITH GFR
BUN: 17 mg/dL (ref 7–25)
CO2: 24 mmol/L (ref 20–32)
Calcium: 9.4 mg/dL (ref 8.6–10.3)
Chloride: 105 mmol/L (ref 98–110)
Creat: 0.98 mg/dL (ref 0.60–1.35)
GFR, Est African American: 119 mL/min/{1.73_m2} (ref >=60)
GFR, Est Non African American: 103 mL/min/{1.73_m2} (ref >=60)
Glucose, Bld: 92 mg/dL (ref 65–99)
Potassium: 4.2 mmol/L (ref 3.5–5.3)
Sodium: 139 mmol/L (ref 135–146)

## 2020-08-14 LAB — HEPATITIS C ANTIBODY
Hepatitis C Ab: NONREACTIVE
SIGNAL TO CUT-OFF: 0.01 (ref <1.00)

## 2020-08-17 ENCOUNTER — Other Ambulatory Visit: Payer: Managed Care, Other (non HMO)

## 2020-09-21 ENCOUNTER — Ambulatory Visit: Payer: Managed Care, Other (non HMO) | Admitting: Allergy

## 2020-11-08 ENCOUNTER — Ambulatory Visit: Payer: Managed Care, Other (non HMO) | Admitting: Allergy

## 2020-11-08 ENCOUNTER — Other Ambulatory Visit: Payer: Self-pay

## 2021-06-11 ENCOUNTER — Ambulatory Visit (INDEPENDENT_AMBULATORY_CARE_PROVIDER_SITE_OTHER)
Admission: RE | Admit: 2021-06-11 | Discharge: 2021-06-11 | Disposition: A | Discharge status: Home / Self Care | Payer: Managed Care, Other (non HMO) | Source: Ambulatory Visit | Attending: Family Medicine | Admitting: Family Medicine

## 2021-06-11 ENCOUNTER — Ambulatory Visit (INDEPENDENT_AMBULATORY_CARE_PROVIDER_SITE_OTHER): Payer: Managed Care, Other (non HMO) | Admitting: Family Medicine

## 2021-06-11 ENCOUNTER — Encounter: Payer: Self-pay | Admitting: Family Medicine

## 2021-06-11 ENCOUNTER — Other Ambulatory Visit: Payer: Self-pay

## 2021-06-11 VITALS — BP 110/78 | HR 80 | Temp 98.4°F | Wt 207.0 lb

## 2021-06-11 DIAGNOSIS — R071 Chest pain on breathing: Secondary | ICD-10-CM

## 2021-06-11 MED ORDER — TRAMADOL HCL 50 MG PO TABS: 100.0000 mg | ORAL_TABLET | Freq: Four times a day (QID) | ORAL | 0 refills | Status: DC | PRN

## 2021-06-11 NOTE — Progress Notes (Signed)
   Subjective:    Patient ID: Gabriel Warner, male    DOB: May 26, 1991, 30 y.o.   MRN: [id number]  HPI Here for 2 days of sharp right sided chest pains and mild SOB. No fever or cough. No recent trauma. He has been packing boxes getting ready to move later this week. Ibuprofen does not help much.    Review of Systems  Constitutional: Negative.   Respiratory:  Positive for shortness of breath. Negative for cough, choking and wheezing.   Cardiovascular:  Positive for chest pain. Negative for palpitations and leg swelling.  Gastrointestinal: Negative.       Objective:   Physical Exam Constitutional:      General: He is not in acute distress.    Appearance: Normal appearance.  Cardiovascular:     Rate and Rhythm: Normal rate and regular rhythm.     Pulses: Normal pulses.     Heart sounds: Normal heart sounds.  Pulmonary:     Effort: Pulmonary effort is normal. No respiratory distress.     Breath sounds: No stridor. No wheezing, rhonchi or rales.     Comments: BS are slightly diminished on the right side. There is no chest wall tenderness anywhere  Neurological:     Mental Status: He is alert.          Assessment & Plan:  Right sided chest pain with SOB. This could be musculoskeletal or he could have a a spontaneous pneumothorax. We will get a CXR today. He can use Tramadol for pain.  Gershon Crane, MD

## 2021-06-12 ENCOUNTER — Telehealth: Payer: Self-pay | Admitting: Family Medicine

## 2021-06-12 NOTE — Telephone Encounter (Signed)
Pt is calling in stating that he rec'd his xray result and wanted to know what they mean pt is aware that the provider has not rec'd it and once he does and results them someone from the office will give him a call back.

## 2021-06-14 NOTE — Telephone Encounter (Signed)
I am not sure what he means by this description. We determined he had a muscle strain. He can rest this weekend and use the medication provided. Follow up next week if he is not any better

## 2021-06-14 NOTE — Telephone Encounter (Signed)
Reviewed chest x-ray report with pt state that he now has a weird gaggling feeling in his chest, pt wants to know what he needs to do next. Please advise

## 2021-06-17 NOTE — Telephone Encounter (Signed)
Left detailed message for pt, advised of Dr Clent Ridges recommendation, advised pt to cal the office and schedule f/u appointment if not better.

## 2021-09-06 ENCOUNTER — Encounter: Payer: Managed Care, Other (non HMO) | Admitting: Family Medicine

## 2021-09-20 ENCOUNTER — Other Ambulatory Visit: Payer: Self-pay

## 2021-09-20 ENCOUNTER — Ambulatory Visit (INDEPENDENT_AMBULATORY_CARE_PROVIDER_SITE_OTHER): Payer: Managed Care, Other (non HMO) | Admitting: Family Medicine

## 2021-09-20 ENCOUNTER — Encounter: Payer: Self-pay | Admitting: Family Medicine

## 2021-09-20 ENCOUNTER — Other Ambulatory Visit: Payer: Managed Care, Other (non HMO)

## 2021-09-20 VITALS — BP 116/78 | HR 80 | Temp 97.7°F | Resp 12 | Ht 69.0 in | Wt 205.6 lb

## 2021-09-20 DIAGNOSIS — G4700 Insomnia, unspecified: Secondary | ICD-10-CM | POA: Diagnosis not present

## 2021-09-20 DIAGNOSIS — E785 Hyperlipidemia, unspecified: Secondary | ICD-10-CM | POA: Diagnosis not present

## 2021-09-20 DIAGNOSIS — Z1329 Encounter for screening for other suspected endocrine disorder: Secondary | ICD-10-CM | POA: Diagnosis not present

## 2021-09-20 DIAGNOSIS — Z0000 Encounter for general adult medical examination without abnormal findings: Secondary | ICD-10-CM

## 2021-09-20 DIAGNOSIS — Z13228 Encounter for screening for other metabolic disorders: Secondary | ICD-10-CM

## 2021-09-20 DIAGNOSIS — Z130 Encounter for screening for diseases of the blood and blood-forming organs and certain disorders involving the immune mechanism: Secondary | ICD-10-CM

## 2021-09-20 DIAGNOSIS — Z23 Encounter for immunization: Secondary | ICD-10-CM | POA: Diagnosis not present

## 2021-09-20 DIAGNOSIS — E782 Mixed hyperlipidemia: Secondary | ICD-10-CM | POA: Insufficient documentation

## 2021-09-20 NOTE — Patient Instructions (Addendum)
A few things to remember from today's visit:  Routine general medical examination at a health care facility  Hyperlipidemia, mixed  Need for immunization against influenza - Plan: Flu Vaccine QUAD 71mo+IM (Fluarix, Fluzone & Alfiuria Quad PF)  Insomnia, unspecified type - Plan: TSH  Screening for endocrine, metabolic and immunity disorder - Plan: Hemoglobin A1c  For insomnia try Melatonin or Unisom over the counter. Good sleep hygiene.  Do not use My Chart to request refills or for acute issues that need immediate attention.   Please be sure medication list is accurate. If a new problem present, please set up appointment sooner than planned today.  At least 150 minutes of moderate exercise per week, daily brisk walking for 15-30 min is a good exercise option. Healthy diet low in saturated (animal) fats and sweets and consisting of fresh fruits and vegetables, lean meats such as fish and white chicken and whole grains.  - Vaccines:  Tdap vaccine every 10 years.  Shingles vaccine recommended at age 53, could be given after 32 years of age but not sure about insurance coverage.  Pneumonia vaccines: Pneumovax at 54  -Screening recommendations for low/normal risk males:  Screening for diabetes at age 44 and every 3 years. Earlier screening if cardiovascular risk factors.   Lipid screening at 35 and every 3 years. Screening starts in younger males with cardiovascular risk factors.  Colon cancer screening is now at age 35 but your insurance may not cover until age 48 .screening is recommended age 76.  Prostate cancer screening: some controversy, starts usually at 50: Rectal exam and PSA.  Aortic Abdominal Aneurism once between 84 and 30 years old if ever smoker.  Also recommended:  Dental visit- Brush and floss your teeth twice daily; visit your dentist twice a year. Eye doctor- Get an eye exam at least every 2 years. Helmet use- Always wear a helmet when riding a bicycle,  motorcycle, rollerblading or skateboarding. Safe sex- If you may be exposed to sexually transmitted infections, use a condom. Seat belts- Seat belts can save your live; always wear one. Smoke/Carbon Monoxide detectors- These detectors need to be installed on the appropriate level of your home. Replace batteries at least once a year. Skin cancer- When out in the sun please cover up and use sunscreen 15 SPF or higher. Violence- If anyone is threatening or hurting you, please tell your healthcare provider.  Drink alcohol in moderation- Limit alcohol intake to one drink or less per day. Never drink and drive.

## 2021-09-20 NOTE — Progress Notes (Signed)
HPI: [name] is a 30 y.o.male here today for his routine physical examination.  Last CPE: 07/27/2020 He lives with wife and 2 children.  Regular exercise 3 or more times per week: Walking daily if possible to walk his dog. Following a healthy diet: His wife is pregnant with their 3rd child and dx'ed with gestational diabetes.  Chronic medical problems: Allergic rhinitis and hyperlipidemia.  Immunization History  Administered Date(s) Administered   Influenza, Quadrivalent, Recombinant, Inj, Pf 10/22/2019   Influenza,inj,Quad PF,6+ Mos 09/28/2017, 09/20/2021   Influenza-Unspecified 09/13/2018   PFIZER(Purple Top)SARS-COV-2 Vaccination 03/06/2020, 03/24/2020   Tdap 07/09/2018   -Hep C screening: 08/10/2020 NR.  Negative for high alcohol intake, tobacco use. Alcohols: Weekends. Having some problems with sleep. Loud snoring, waking up. He is planning on getting a mouth piece to help with this problem, following with dentist. Sleeping about 6-7 hours.  -Concerns and/or follow up today:  HLD: He is non pharmacologic treatment.  Lab Results  Component Value Date   CHOL 218 (H) 08/10/2020   HDL 48 08/10/2020   LDLCALC 143 (H) 08/10/2020   LDLDIRECT 162.0 09/27/2018   TRIG 145 08/10/2020   CHOLHDL 4.5 08/10/2020   Review of Systems  Constitutional:  Positive for fatigue. Negative for activity change, appetite change and fever.  HENT:  Negative for nosebleeds, sore throat, trouble swallowing and voice change.   Eyes:  Negative for redness and visual disturbance.  Respiratory:  Negative for cough, shortness of breath and wheezing.   Cardiovascular:  Negative for chest pain, palpitations and leg swelling.  Gastrointestinal:  Negative for abdominal pain, blood in stool, nausea and vomiting.  Endocrine: Negative for cold intolerance, heat intolerance, polydipsia, polyphagia and polyuria.  Genitourinary:  Negative for decreased urine volume, dysuria, genital sores,  hematuria and testicular pain.  Musculoskeletal:  Negative for arthralgias, back pain, joint swelling and myalgias.  Skin:  Negative for color change and rash.  Allergic/Immunologic: Positive for environmental allergies.  Neurological:  Negative for syncope, facial asymmetry, weakness and headaches.  Hematological:  Negative for adenopathy. Does not bruise/bleed easily.  Psychiatric/Behavioral:  Positive for sleep disturbance. Negative for confusion. The patient is not nervous/anxious.    No current outpatient medications on file prior to visit.   No current facility-administered medications on file prior to visit.   Past Medical History:  Diagnosis Date   Allergy    Hyperlipidemia    Past Surgical History:  Procedure Laterality Date   FUNCTIONAL ENDOSCOPIC SINUS SURGERY  2014   No Known Allergies  Family History  Problem Relation Age of Onset   Hypertension Mother    Social History   Socioeconomic History   Marital status: Married    Spouse name: Not on file   Number of children: Not on file   Years of education: Not on file   Highest education level: Not on file  Occupational History   Not on file  Tobacco Use   Smoking status: Never   Smokeless tobacco: Never  Vaping Use   Vaping Use: Never used  Substance and Sexual Activity   Alcohol use: No    Comment: Social-mix of beer,wine, or liquor   Drug use: No   Sexual activity: Yes    Partners: Female  Other Topics Concern   Not on file  Social History Narrative   Not on file   Social Determinants of Health   Financial Resource Strain: Not on file  Food Insecurity: Not on file  Transportation Needs: Not  on file  Physical Activity: Not on file  Stress: Not on file  Social Connections: Not on file   Vitals:   09/20/21 1400  BP: 116/78  Pulse: 80  Resp: 12  Temp: 97.7 F (36.5 C)  SpO2: 97%   Body mass index is 30.36 kg/m.  Wt Readings from Last 3 Encounters:  09/20/21 205 lb 9.6 oz (93.3 kg)   06/11/21 207 lb (93.9 kg)  07/27/20 209 lb 4 oz (94.9 kg)   Physical Exam Vitals and nursing note reviewed.  Constitutional:      General: He is not in acute distress.    Appearance: He is well-developed.  HENT:     Head: Atraumatic.     Right Ear: Tympanic membrane, ear canal and external ear normal.     Left Ear: Tympanic membrane, ear canal and external ear normal.  Eyes:     Conjunctiva/sclera: Conjunctivae normal.     Pupils: Pupils are equal, round, and reactive to light.  Neck:     Thyroid: No thyromegaly.     Trachea: No tracheal deviation.  Cardiovascular:     Rate and Rhythm: Normal rate and regular rhythm.     Pulses:          Dorsalis pedis pulses are 2+ on the right side and 2+ on the left side.     Heart sounds: No murmur heard. Pulmonary:     Effort: Pulmonary effort is normal. No respiratory distress.     Breath sounds: Normal breath sounds.  Abdominal:     Palpations: Abdomen is soft. There is no hepatomegaly or mass.     Tenderness: There is no abdominal tenderness.  Genitourinary:    Comments: No concerns. Musculoskeletal:        General: No tenderness.     Cervical back: Normal range of motion.     Comments: No major deformities appreciated and no signs of synovitis.  Lymphadenopathy:     Cervical: No cervical adenopathy.     Upper Body:     Right upper body: No supraclavicular adenopathy.     Left upper body: No supraclavicular adenopathy.  Skin:    General: Skin is warm.     Findings: No erythema.  Neurological:     Mental Status: He is alert and oriented to person, place, and time.     Cranial Nerves: No cranial nerve deficit.     Sensory: No sensory deficit.     Coordination: Coordination normal.     Gait: Gait normal.     Deep Tendon Reflexes:     Reflex Scores:      Bicep reflexes are 2+ on the right side and 2+ on the left side.      Patellar reflexes are 2+ on the right side and 2+ on the left side.  ASSESSMENT AND PLAN:  Mr.Gabriel Warner  was seen today for annual exam.  Diagnoses and all orders for this visit: Orders Placed This Encounter  Procedures   Flu Vaccine QUAD 34mo+IM (Fluarix, Fluzone & Alfiuria Quad PF)   TSH   Hemoglobin A1c   Routine general medical examination at a health care facility We discussed the importance of regular physical activity and healthy diet for prevention of chronic illness and/or complications. Preventive guidelines reviewed. Vaccination up to date.  Next CPE in a year.  Need for immunization against influenza -     Flu Vaccine QUAD 35mo+IM (Fluarix, Fluzone & Alfiuria Quad PF)  Insomnia, unspecified type Recommend trying Melatonin 5-10  mg 2 hours before bedtime with empty stomach. OTC Unisom is another option. Good sleep hygiene.  Screening for endocrine, metabolic and immunity disorder -     Hemoglobin A1c; Future  Hyperlipidemia, unspecified Non pharmacologic treatment recommended for now. Further recommendations will be given according to 10 years CVD risk score and lipid panel numbers. He is not fasting today, will come back next week for fasting labs.  Return in 1 year (on 09/20/2022) for Labs in a few days, fasting. Lab appt for next year..   Gabriel Warner G. [place], MD  [facility]. [place] office.

## 2021-09-20 NOTE — Assessment & Plan Note (Signed)
Non pharmacologic treatment recommended for now. Further recommendations will be given according to 10 years CVD risk score and lipid panel numbers. 

## 2021-10-04 ENCOUNTER — Other Ambulatory Visit: Payer: Self-pay

## 2021-10-04 ENCOUNTER — Other Ambulatory Visit (INDEPENDENT_AMBULATORY_CARE_PROVIDER_SITE_OTHER): Payer: Managed Care, Other (non HMO)

## 2021-10-04 DIAGNOSIS — Z13228 Encounter for screening for other metabolic disorders: Secondary | ICD-10-CM

## 2021-10-04 DIAGNOSIS — G4700 Insomnia, unspecified: Secondary | ICD-10-CM | POA: Diagnosis not present

## 2021-10-04 DIAGNOSIS — Z0000 Encounter for general adult medical examination without abnormal findings: Secondary | ICD-10-CM

## 2021-10-04 DIAGNOSIS — Z1329 Encounter for screening for other suspected endocrine disorder: Secondary | ICD-10-CM

## 2021-10-04 DIAGNOSIS — E782 Mixed hyperlipidemia: Secondary | ICD-10-CM

## 2021-10-04 DIAGNOSIS — Z130 Encounter for screening for diseases of the blood and blood-forming organs and certain disorders involving the immune mechanism: Secondary | ICD-10-CM

## 2021-10-04 LAB — BASIC METABOLIC PANEL
BUN: 15 mg/dL (ref 6–23)
CO2: 29 mEq/L (ref 19–32)
Calcium: 9.6 mg/dL (ref 8.4–10.5)
Chloride: 102 mEq/L (ref 96–112)
Creatinine, Ser: 0.96 mg/dL (ref 0.40–1.50)
GFR: 104.99 mL/min (ref >60.00)
Glucose, Bld: 91 mg/dL (ref 70–99)
Potassium: 4 mEq/L (ref 3.5–5.1)
Sodium: 139 mEq/L (ref 135–145)

## 2021-10-04 LAB — LIPID PANEL
Cholesterol: 263 mg/dL — ABNORMAL HIGH (ref 0–200)
HDL: 61.7 mg/dL (ref >39.00)
LDL Cholesterol: 177 mg/dL — ABNORMAL HIGH (ref 0–99)
NonHDL: 200.83
Total CHOL/HDL Ratio: 4
Triglycerides: 119 mg/dL (ref 0.0–149.0)
VLDL: 23.8 mg/dL (ref 0.0–40.0)

## 2021-10-04 LAB — TSH: TSH: 1.42 u[IU]/mL (ref 0.35–5.50)

## 2021-10-04 LAB — HEMOGLOBIN A1C: Hgb A1c MFr Bld: 5.4 % (ref 4.6–6.5)

## 2021-10-18 ENCOUNTER — Other Ambulatory Visit: Payer: Self-pay

## 2021-10-18 MED ORDER — LOVASTATIN 20 MG PO TABS: 20.0000 mg | ORAL_TABLET | Freq: Every day | ORAL | 6 refills | Status: DC

## 2022-01-16 ENCOUNTER — Telehealth (INDEPENDENT_AMBULATORY_CARE_PROVIDER_SITE_OTHER): Payer: Managed Care, Other (non HMO) | Admitting: Family Medicine

## 2022-01-16 ENCOUNTER — Encounter: Payer: Self-pay | Admitting: Family Medicine

## 2022-01-16 VITALS — Temp 99.0°F

## 2022-01-16 DIAGNOSIS — U071 COVID-19: Secondary | ICD-10-CM | POA: Diagnosis not present

## 2022-01-16 MED ORDER — NIRMATRELVIR/RITONAVIR (PAXLOVID)TABLET: 3.0000 | ORAL_TABLET | Freq: Two times a day (BID) | ORAL | 0 refills | Status: AC

## 2022-01-16 MED ORDER — PROMETHAZINE-DM 6.25-15 MG/5ML PO SYRP: 5.0000 mL | ORAL_SOLUTION | Freq: Four times a day (QID) | ORAL | 0 refills | Status: DC | PRN

## 2022-01-16 NOTE — Patient Instructions (Addendum)
---------------------------------------------------------------------------------------------------------------------------    WORK SLIP:  Patient Gabriel Warner,  [birth year], was seen for a medical visit today, 01/16/22 . Please excuse from work for a COVID/flu like illness. If Covid19 testing is positive advise 10 days minimum from the onset of symptoms (01/15/22) PLUS 1 day of no fever and improved symptoms. Will defer to employer for a sooner return to work if patient has 2 negative covid tests 48 hours apart and is feeling better, or if symptoms have resolved, it is greater than 5 days since the positive test and the patient can wear a high-quality, tight fitting mask such as N95 or KN95 at all times for an additional 5 days. Would also suggest COVID19 antigen testing is negative prior to early return from a Covid illness.  Sincerely: E-signature: Dr. Colin Benton, DO [facility] [place] Ph: [phone]   ------------------------------------------------------------------------------------------------------------------------------     HOME CARE TIPS:   -I sent the medication(s) we discussed to your pharmacy: Meds ordered this encounter  Medications   promethazine-dextromethorphan (PROMETHAZINE-DM) 6.25-15 MG/5ML syrup    Sig: Take 5 mLs by mouth 4 (four) times daily as needed for cough.    Dispense:  118 mL    Refill:  0     -I sent in the [facility] treatment or referral you requested per our discussion. Please see the information provided below and discuss further with the pharmacist/treatment team.  -If taking Paxlovid, please review all medications, supplement and over the counter drugs with your pharmacist and ask them to check for any interactions. Please make the following changes to your regular medications while taking Paxlovid: *STOP your Lovastatin while taking Paxlovid and restart 3 days after finishing your Paxlovid course  -there is a chance of  rebound illness after finishing your treatment. If you become sick again please isolate for an additional 5 days, plus 5 more days of masking.   -can use tylenol or aleve if needed for fevers, aches and pains per instructions  -can use nasal saline a few times per day if you have nasal congestion  -stay hydrated, drink plenty of fluids and eat small healthy meals - avoid dairy  -can take 1000 IU (73mg) Vit D3 and 100-500 mg of Vit C daily per instructions  -If the Covid test is positive, [facility] out [facility] for more information on home care, transmission and treatment for COVID19  -follow up with your doctor in 2-3 days unless improving and feeling better  -stay home while sick, except to seek medical care. If you have COVID19, you will likely be contagious for 7-10 days. Flu or Influenza is likely contagious for about 7 days. Other respiratory viral infections remain contagious for 5-10+ days depending on the virus and many other factors. Wear a good mask that fits snugly (such as N95 or KN95) if around others to reduce the risk of transmission.  It was nice to meet you today, and I really hope you are feeling better soon. I help [facility] out with telemedicine visits on Tuesdays and Thursdays and am happy to help if you need a follow up virtual visit on those days. Otherwise, if you have any concerns or questions following this visit please schedule a follow up visit with your Primary Care doctor or seek care at a local urgent care clinic to avoid delays in care.    Seek in person care or schedule a follow up video visit promptly if your symptoms worsen, new concerns arise or  you are not improving with treatment. Call 911 and/or seek emergency care if your symptoms are severe or life threatening.  FACT SHEET FOR PATIENTS, PARENTS, AND CAREGIVERS EMERGENCY USE AUTHORIZATION (EUA) OF PAXLOVID FOR CORONAVIRUS DISEASE 2019 (COVID-19) You are being given this Fact Sheet because your  healthcare provider believes it is necessary to provide you with PAXLOVID for the treatment of mild-to-moderate coronavirus disease (COVID-19) caused by the SARS-CoV-2 virus. This Fact Sheet contains information to help you understand the risks and benefits of taking the PAXLOVID you have received or may receive. The U.S. Food and Drug Administration (FDA) has issued an Emergency Use Authorization (EUA) to make PAXLOVID available during the COVID-19 pandemic (for more details about an EUA please see What is an Emergency Use Authorization? at the end of this document). PAXLOVID is not an FDA-approved medicine in the [place]. Read this Fact Sheet for information about PAXLOVID. Talk to your healthcare provider about your options or if you have any questions. It is your choice to take PAXLOVID.  What is COVID-19? COVID-19 is caused by a virus called a coronavirus. You can get COVID-19 through close contact with another person who has the virus. COVID-19 illnesses have ranged from very mild-to-severe, including illness resulting in death. While information so far suggests that most COVID-19 illness is mild, serious illness can happen and may cause some of your other medical conditions to become worse. Older people and people of all ages with severe, long lasting (chronic) medical conditions like heart disease, lung disease, and diabetes, for example seem to be at higher risk of being hospitalized for COVID-19.  What is PAXLOVID? PAXLOVID is an investigational medicine used to treat mild-to-moderate COVID-19 in adults and children [42 years of age and older weighing at least 72 pounds (47 kg)] with positive results of direct SARS-CoV-2 viral testing, and who are at high risk for progression to severe COVID-19, including hospitalization or death. PAXLOVID is investigational because it is still being studied. There is limited information about the safety and effectiveness of using  PAXLOVID to treat people with mild-to-moderate COVID-19.  The FDA has authorized the emergency use of PAXLOVID for the treatment of mild-tomoderate COVID-19 in adults and children [33 years of age and older weighing at least 79 pounds (61 kg)] with a positive test for the virus that causes COVID-19, and who are at high risk for progression to severe COVID-19, including hospitalization or death, under an EUA. 1 Revised: 22 February 2021   What should I tell my healthcare provider before I take PAXLOVID? Tell your healthcare provider if you: ? Have any allergies ? Have liver or kidney disease ? Are pregnant or plan to become pregnant ? Are breastfeeding a child ? Have any serious illnesses  Tell your healthcare provider about all the medicines you take, including prescription and over-the-counter medicines, vitamins, and herbal supplements. Some medicines may interact with PAXLOVID and may cause serious side effects. Keep a list of your medicines to show your healthcare provider and pharmacist when you get a new medicine.  You can ask your healthcare provider or pharmacist for a list of medicines that interact with PAXLOVID. Do not start taking a new medicine without telling your healthcare provider. Your healthcare provider can tell you if it is safe to take PAXLOVID with other medicines.  Tell your healthcare provider if you are taking combined hormonal contraceptive. PAXLOVID may affect how your birth control pills work. Females who are able to become pregnant should use another  effective alternative form of contraception or an additional barrier method of contraception. Talk to your healthcare provider if you have any questions about contraceptive methods that might be right for you.  How do I take PAXLOVID? ? PAXLOVID consists of 2 medicines: nirmatrelvir and ritonavir. o Take 2 pink tablets of nirmatrelvir with 1 white tablet of ritonavir by mouth 2 times each day (in the  morning and in the evening) for 5 days. For each dose, take all 3 tablets at the same time. o If you have kidney disease, talk to your healthcare provider. You may need a different dose. ? Swallow the tablets whole. Do not chew, break, or crush the tablets. ? Take PAXLOVID with or without food. ? Do not stop taking PAXLOVID without talking to your healthcare provider, even if you feel better. ? If you miss a dose of PAXLOVID within 8 hours of the time it is usually taken, take it as soon as you remember. If you miss a dose by more than 8 hours, skip the missed dose and take the next dose at your regular time. Do not take 2 doses of PAXLOVID at the same time. ? If you take too much PAXLOVID, call your healthcare provider or go to the nearest hospital emergency room right away. ? If you are taking a ritonavir- or cobicistat-containing medicine to treat hepatitis C or Human Immunodeficiency Virus (HIV), you should continue to take your medicine as prescribed by your healthcare provider. 2 Revised: 22 February 2021    Talk to your healthcare provider if you do not feel better or if you feel worse after 5 days.  Who should generally not take PAXLOVID? Do not take PAXLOVID if: ? You are allergic to nirmatrelvir, ritonavir, or any of the ingredients in PAXLOVID. ? You are taking any of the following medicines: o Alfuzosin o Pethidine, propoxyphene o Ranolazine o Amiodarone, dronedarone, flecainide, propafenone, quinidine o Colchicine o Lurasidone, pimozide, clozapine o Dihydroergotamine, ergotamine, methylergonovine o Lovastatin, simvastatin o Sildenafil (Revatio) for pulmonary arterial hypertension (PAH) o Triazolam, oral midazolam o Apalutamide o Carbamazepine, phenobarbital, phenytoin o Rifampin o St. Johns Wort (hypericum perforatum) Taking PAXLOVID with these medicines may cause serious or life-threatening side effects or affect how PAXLOVID works.  These are not the only  medicines that may cause serious side effects if taken with PAXLOVID. PAXLOVID may increase or decrease the levels of multiple other medicines. It is very important to tell your healthcare provider about all of the medicines you are taking because additional laboratory tests or changes in the dose of your other medicines may be necessary while you are taking PAXLOVID. Your healthcare provider may also tell you about specific symptoms to watch out for that may indicate that you need to stop or decrease the dose of some of your other medicines.  What are the important possible side effects of PAXLOVID? Possible side effects of PAXLOVID are: ? Allergic Reactions. Allergic reactions can happen in people taking PAXLOVID, even after only 1 dose. Stop taking PAXLOVID and call your healthcare provider right away if you get any of the following symptoms of an allergic reaction: o hives o trouble swallowing or breathing o swelling of the mouth, lips, or face o throat tightness o hoarseness 3 Revised: 22 February 2021  o skin rash ? Liver Problems. Tell your healthcare provider right away if you have any of these signs and symptoms of liver problems: loss of appetite, yellowing of your skin and the whites of eyes (jaundice),  dark-colored urine, pale colored stools and itchy skin, stomach area (abdominal) pain. ? Resistance to HIV Medicines. If you have untreated HIV infection, PAXLOVID may lead to some HIV medicines not working as well in the future. ? Other possible side effects include: o altered sense of taste o diarrhea o high blood pressure o muscle aches These are not all the possible side effects of PAXLOVID. Not many people have taken PAXLOVID. Serious and unexpected side effects may happen. PAXLOVID is still being studied, so it is possible that all of the risks are not known at this time.  What other treatment choices are there? Veklury (remdesivir) is FDA-approved for the treatment  of mild-to-moderate EGBTD-17 in certain adults and children. Talk with your doctor to see if Marijean Heath is appropriate for you. Like PAXLOVID, FDA may also allow for the emergency use of other medicines to treat people with COVID-19. Go to https://price.info/ for information on the emergency use of other medicines that are authorized by FDA to treat people with COVID-19. Your healthcare provider may talk with you about clinical trials for which you may be eligible. It is your choice to be treated or not to be treated with PAXLOVID. Should you decide not to receive it or for your child not to receive it, it will not change your standard medical care.  What if I am pregnant or breastfeeding? There is no experience treating pregnant women or breastfeeding mothers with PAXLOVID. For a mother and unborn baby, the benefit of taking PAXLOVID may be greater than the risk from the treatment. If you are pregnant, discuss your options and specific situation with your healthcare provider. It is recommended that you use effective barrier contraception or do not have sexual activity while taking PAXLOVID. If you are breastfeeding, discuss your options and specific situation with your healthcare provider. 4 Revised: 22 February 2021   How do I report side effects with PAXLOVID? Contact your healthcare provider if you have any side effects that bother you or do not go away. Report side effects to FDA MedWatch at SmoothHits.hu or call 1-800-FDA1088 or you can report side effects to [employer]. at the contact information provided below. Website Fax number Telephone number www.pfizersafetyreporting.com [phone] How should I store Touchet? Store PAXLOVID tablets at room temperature, between 68?F to 77?F (20?C to 25?C). How can I learn more about COVID-19? ? Ask your healthcare  provider. ? Visit https://jacobson-johnson.com/. ? Contact your local or state public health department. What is an Emergency Use Authorization (EUA)? The [place] FDA has made PAXLOVID available under an emergency access mechanism called an Emergency Use Authorization (EUA). The EUA is supported by a Education officer, museum and Human Service (HHS) declaration that circumstances exist to justify the emergency use of drugs and biological products during the COVID-19 pandemic. PAXLOVID for the treatment of mild-to-moderate COVID-19 in adults and children [77 years of age and older weighing at least 49 pounds (51 kg)] with positive results of direct SARS-CoV-2 viral testing, and who are at high risk for progression to severe COVID-19, including hospitalization or death, has not undergone the same type of review as an FDA-approved product. In issuing an EUA under the JKKXF-81 public health emergency, the FDA has determined, among other things, that based on the total amount of scientific evidence available including data from adequate and well-controlled clinical trials, if available, it is reasonable to believe that the product may be effective for diagnosing, treating, or preventing COVID-19, or a serious or life-threatening disease  or condition caused by COVID-19; that the known and potential benefits of the product, when used to diagnose, treat, or prevent such disease or condition, outweigh the known and potential risks of such product; and that there are no adequate, approved, and available alternatives. All of these criteria must be met to allow for the product to be used in the treatment of patients during the COVID-19 pandemic. The EUA for PAXLOVID is in effect for the duration of the COVID-19 declaration justifying emergency use of this product, unless terminated or revoked (after which the products may no longer be used under the EUA). 5 Revised: 22 February 2021     Additional  Information For general questions, visit the website or call the telephone number provided below. Website Telephone number www.COVID19oralRx.com [phone] (1-877-C19-PACK) You can also go to www.pfizermedinfo.com or call [phone]) [phone] for more information. [id number] Revised: 22 February 2021

## 2022-01-16 NOTE — Progress Notes (Signed)
Virtual Visit via Video Note  I connected with Gabriel Warner  on 01/16/22 at 10:00 AM EST by a video enabled telemedicine application and verified that I am speaking with the correct person using two identifiers.  Location patient: [place] Location provider:work or home office Persons participating in the virtual visit: patient, provider  I discussed the limitations and requested verbal permission for telemedicine visit. The patient expressed understanding and agreed to proceed.   HPI:  Acute telemedicine visit for Covid19: -Onset: yesterday -Symptoms include: low grade fevers, chills, cough, sore throat yesterday, body aches -Denies: CP, SOB, vomiting, diarrhea, loss of taste or smell -GFR 104 10/22 -Pertinent past medical history: see below, has never had covid, obesity -Pertinent medication allergies:No Known Allergies -COVID-19 vaccine status: had first two doses Immunization History  Administered Date(s) Administered   Influenza, Quadrivalent, Recombinant, Inj, Pf 10/22/2019   Influenza,inj,Quad PF,6+ Mos 09/28/2017, 09/20/2021   Influenza-Unspecified 09/13/2018   PFIZER(Purple Top)SARS-COV-2 Vaccination 03/06/2020, 03/24/2020   Tdap 07/09/2018     ROS: See pertinent positives and negatives per HPI.  Past Medical History:  Diagnosis Date   Allergy    Hyperlipidemia     Past Surgical History:  Procedure Laterality Date   FUNCTIONAL ENDOSCOPIC SINUS SURGERY  2014     Current Outpatient Medications:    lovastatin (MEVACOR) 20 MG tablet, Take 1 tablet (20 mg total) by mouth at bedtime., Disp: 30 tablet, Rfl: 6   Multiple Vitamins-Minerals (ZINC PO), Take by mouth daily., Disp: , Rfl:    nirmatrelvir/ritonavir EUA (PAXLOVID) 20 x 150 MG & 10 x 100MG  TABS, Take 3 tablets by mouth 2 (two) times daily for 5 days. (Take nirmatrelvir 150 mg two tablets twice daily for 5 days and ritonavir 100 mg one tablet twice daily for 5 days) Patient GFR is > 60 last lab check in october, Disp: 30  tablet, Rfl: 0   promethazine-dextromethorphan (PROMETHAZINE-DM) 6.25-15 MG/5ML syrup, Take 5 mLs by mouth 4 (four) times daily as needed for cough., Disp: 118 mL, Rfl: 0   VITAMIN D PO, Take by mouth daily., Disp: , Rfl:   EXAM:  VITALS per patient if applicable:  GENERAL: alert, oriented, appears well and in no acute distress  HEENT: atraumatic, conjunttiva clear, no obvious abnormalities on inspection of external nose and ears  NECK: normal movements of the head and neck  LUNGS: on inspection no signs of respiratory distress, breathing rate appears normal, no obvious gross SOB, gasping or wheezing  CV: no obvious cyanosis  MS: moves all visible extremities without noticeable abnormality  PSYCH/NEURO: pleasant and cooperative, no obvious depression or anxiety, speech and thought processing grossly intact  ASSESSMENT AND PLAN:  Discussed the following assessment and plan:  COVID-19   Discussed treatment options and risk of drug interactions, ideal treatment window, potential complications, isolation and precautions for COVID-19.  The patient is requesting antivirals. He is not fully vaccinated and BMI is over 30. Discussed possibility of rebound with antivirals and the need to reisolate if it should occur for 5 days. Checked for/reviewed any labs done in the last 90 days with GFR listed in HPI if available. After lengthy discussion, the patient opted for treatment with Paxlovid due to being higher risk for complications of covid or severe disease and other factors. Discussed EUA status of this drug and the fact that there is preliminary limited knowledge of risks/interactions/side effects per EUA document vs possible benefits and precautions. He agrees to stop his lovastatin for 8 days for treatment. This information  was shared with patient during the visit and also was provided in patient instructions. Also, advised that patient discuss risks/interactions and use with  pharmacist/treatment team as well. The patient did want a prescription for cough, Rx sent.  Other symptomatic care measures summarized in patient instructions. Work/School slipped offered: provided in patient instructions   Advised to seek prompt virtual visit or in person care if worsening, new symptoms arise, or if is not improving with treatment as expected per our conversation of expected course. Discussed options for follow up care. Did let this patient know that I do telemedicine on Tuesdays and Thursdays for [place] and those are the days I am logged into the system. Advised to schedule follow up visit with PCP, [place] virtual visits or [facility] if any further questions or concerns to avoid delays in care.   I discussed the assessment and treatment plan with the patient. The patient was provided an opportunity to ask questions and all were answered. The patient agreed with the plan and demonstrated an understanding of the instructions.     Terressa Koyanagi, DO

## 2022-03-20 ENCOUNTER — Telehealth: Payer: Self-pay | Admitting: Family Medicine

## 2022-03-20 DIAGNOSIS — E782 Mixed hyperlipidemia: Secondary | ICD-10-CM

## 2022-03-20 NOTE — Telephone Encounter (Signed)
Patient states that he would like orders for lab work needed put in as he does not want the office visit. States that this was only a follow up to check/go over levels from medication change so he can just stop by lab instead. Also would like refill for lovastatin (MEVACOR) 20 MG tablet ? ? ? ?Please send to ? ?[id number] - [place], [place] - [address] Phone:  [phone]  ?Fax:  [phone]  ?  ? ? ? ? ? ? ?Please advise  ? ?

## 2022-03-21 MED ORDER — LOVASTATIN 20 MG PO TABS: 20.0000 mg | ORAL_TABLET | Freq: Every day | ORAL | 2 refills | Status: DC

## 2022-03-21 MED ORDER — LOVASTATIN 20 MG PO TABS: 20.0000 mg | ORAL_TABLET | Freq: Every day | ORAL | 6 refills | Status: DC

## 2022-03-21 NOTE — Telephone Encounter (Signed)
Patient had to push appointment out to June for both labs and OV. Also wants to see if a 90 day supply of the lovastatin can be sent in instead, so he does not have to keep going to pick it up every month. ? ? ? ? ?Please advise  ?

## 2022-03-21 NOTE — Addendum Note (Signed)
Addended by: Rodrigo Ran on: 03/21/2022 07:24 AM ? ? Modules accepted: Orders ? ?

## 2022-03-21 NOTE — Telephone Encounter (Signed)
Rx changed to 90 days

## 2022-03-21 NOTE — Telephone Encounter (Signed)
I ordered the labs & sent his refill in. You can schedule labs a few days prior to his 4/28 visit. She wanted to see him too. ?

## 2022-03-21 NOTE — Addendum Note (Signed)
Addended by: Weyman Croon E on: 03/21/2022 11:16 AM ? ? Modules accepted: Orders ? ?

## 2022-04-04 ENCOUNTER — Ambulatory Visit: Payer: Managed Care, Other (non HMO) | Admitting: Family Medicine

## 2022-04-18 ENCOUNTER — Ambulatory Visit: Payer: Managed Care, Other (non HMO) | Admitting: Family Medicine

## 2022-05-09 ENCOUNTER — Other Ambulatory Visit: Payer: Managed Care, Other (non HMO)

## 2022-05-09 ENCOUNTER — Other Ambulatory Visit (INDEPENDENT_AMBULATORY_CARE_PROVIDER_SITE_OTHER): Payer: Managed Care, Other (non HMO)

## 2022-05-09 DIAGNOSIS — E782 Mixed hyperlipidemia: Secondary | ICD-10-CM | POA: Diagnosis not present

## 2022-05-09 LAB — BASIC METABOLIC PANEL
BUN: 16 mg/dL (ref 6–23)
CO2: 29 mEq/L (ref 19–32)
Calcium: 9.3 mg/dL (ref 8.4–10.5)
Chloride: 105 mEq/L (ref 96–112)
Creatinine, Ser: 1 mg/dL (ref 0.40–1.50)
GFR: 99.56 mL/min (ref >60.00)
Glucose, Bld: 99 mg/dL (ref 70–99)
Potassium: 4.2 mEq/L (ref 3.5–5.1)
Sodium: 140 mEq/L (ref 135–145)

## 2022-05-09 LAB — LIPID PANEL
Cholesterol: 184 mg/dL (ref 0–200)
HDL: 46.1 mg/dL (ref >39.00)
LDL Cholesterol: 112 mg/dL — ABNORMAL HIGH (ref 0–99)
NonHDL: 137.66
Total CHOL/HDL Ratio: 4
Triglycerides: 128 mg/dL (ref 0.0–149.0)
VLDL: 25.6 mg/dL (ref 0.0–40.0)

## 2022-05-13 ENCOUNTER — Telehealth: Payer: Self-pay | Admitting: Family Medicine

## 2022-05-13 NOTE — Telephone Encounter (Signed)
Pt is calling and would like blood work results 

## 2022-05-14 NOTE — Telephone Encounter (Signed)
My Chart message sent. Abie Cheek [place], MD

## 2022-05-16 ENCOUNTER — Ambulatory Visit: Payer: Managed Care, Other (non HMO) | Admitting: Family Medicine

## 2022-10-17 ENCOUNTER — Telehealth: Payer: Self-pay | Admitting: Family Medicine

## 2022-10-17 NOTE — Telephone Encounter (Signed)
Slot not available - No one here to override to create the slot.

## 2022-10-17 NOTE — Telephone Encounter (Signed)
Pt called to say he is experiencing:  Congestion, Sinus pressure x 4-5 days   Pt offered an OV:  Pt stated he is caring for a child and could not come in.  Pt offered a VV:  Could not find any availability here or at [facility] for today.  Pt states he knows he has not seen MD in over a year, but is wondering if MD would consider sending something to:  [id number] - [place], [place] - [address] Phone: [phone]  Fax: [phone]      LOV:  09/20/21 = CPE

## 2022-10-17 NOTE — Telephone Encounter (Signed)
Add on as VV for 4:45 today & just let him know it may be closer to 5 before she joins him.

## 2022-10-20 NOTE — Telephone Encounter (Signed)
-  Caller stated they need medicine sent to [facility] for a sinus infection. symptoms started about 5 days ago. denies a fever. denies swelling. has a headache. very thick green mucus.  10/17/2022 6:41:53 PM See PCP within 24 Hours [place], RN, [employer]  Referrals GO TO FACILITY OTHER - SPECIFY [facility]  10/20/22 1023 - LVM requesting call back to check on pt.   Of Note: last CPE & OV 09/20/21. Pt needs CPE scheduled.

## 2022-11-28 IMAGING — DX DG CHEST 2V
2 series · 2 of 2 positions shown · non-contrast
Comparison: No prior.

CLINICAL DATA: Sharp right-sided chest pain.  Shortness of breath.

EXAM:
CHEST - 2 VIEW

[chest pa]
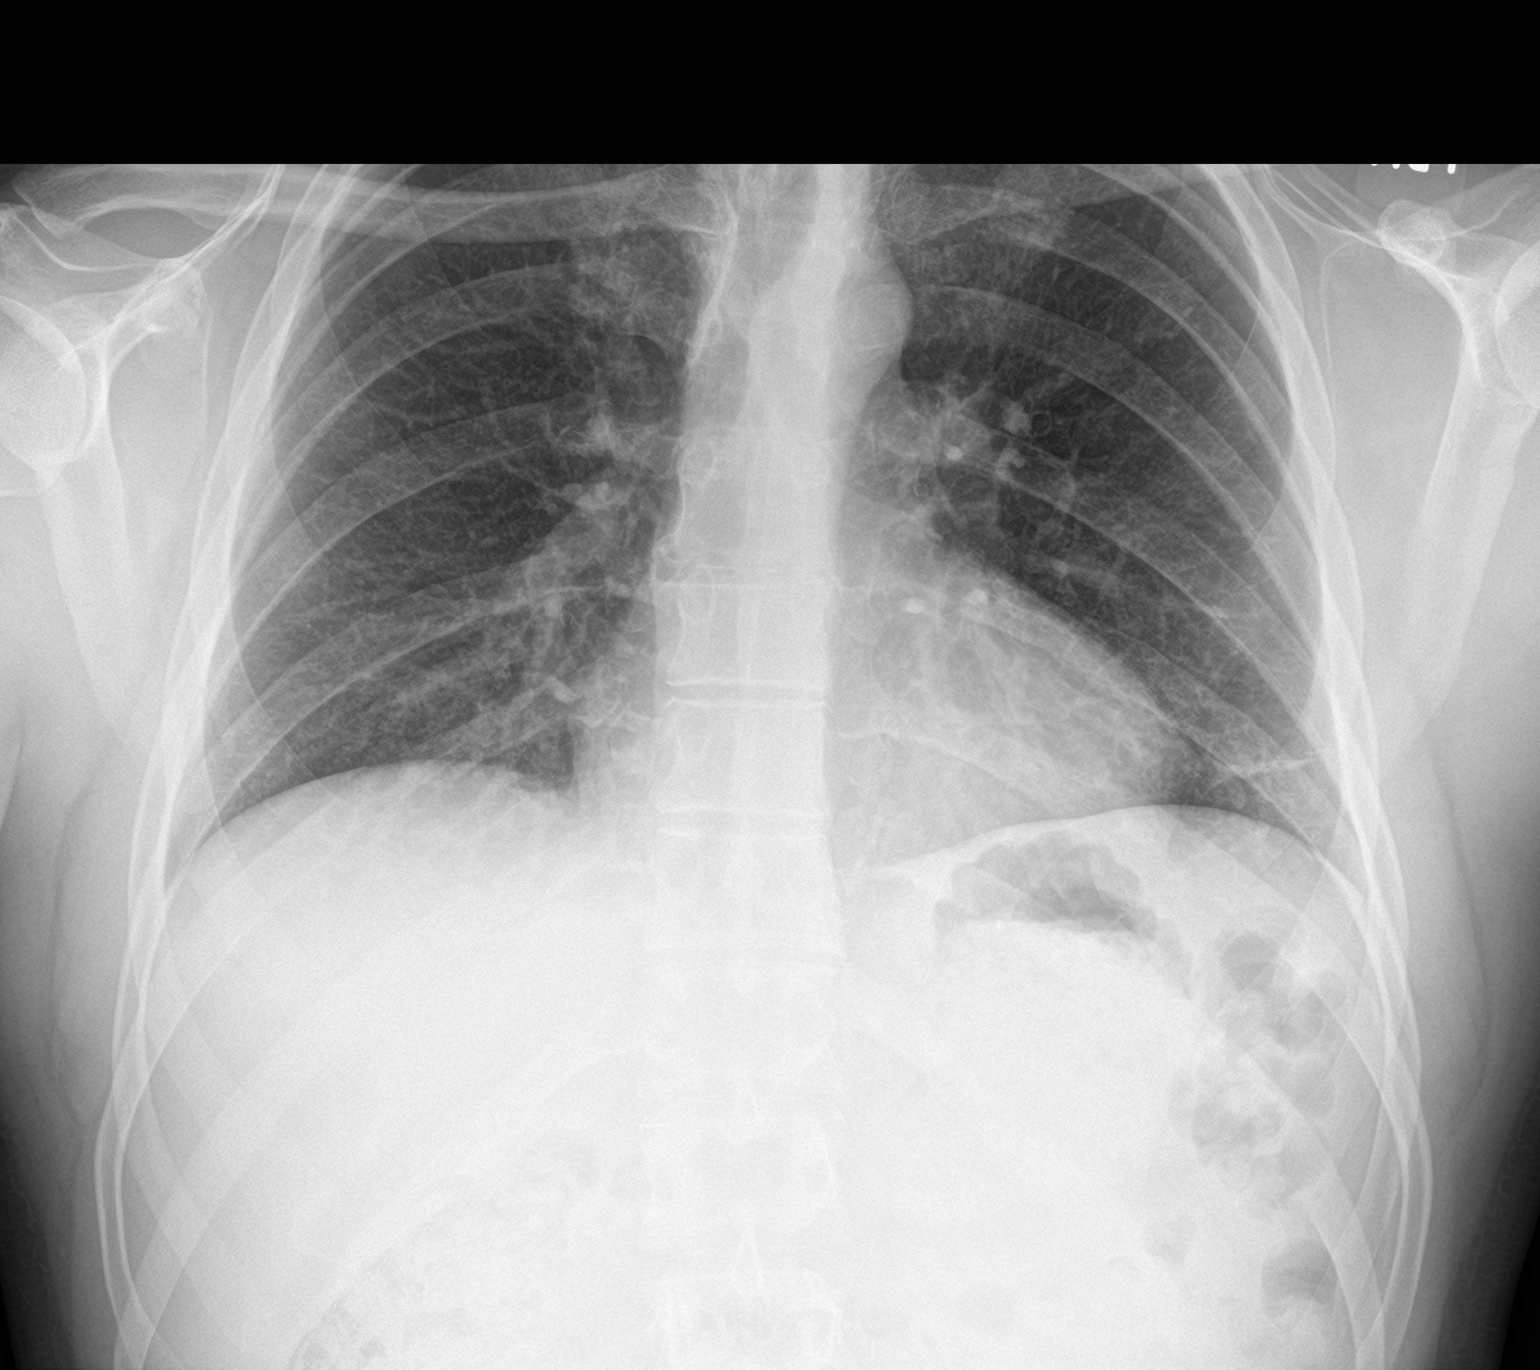

[chest lat]
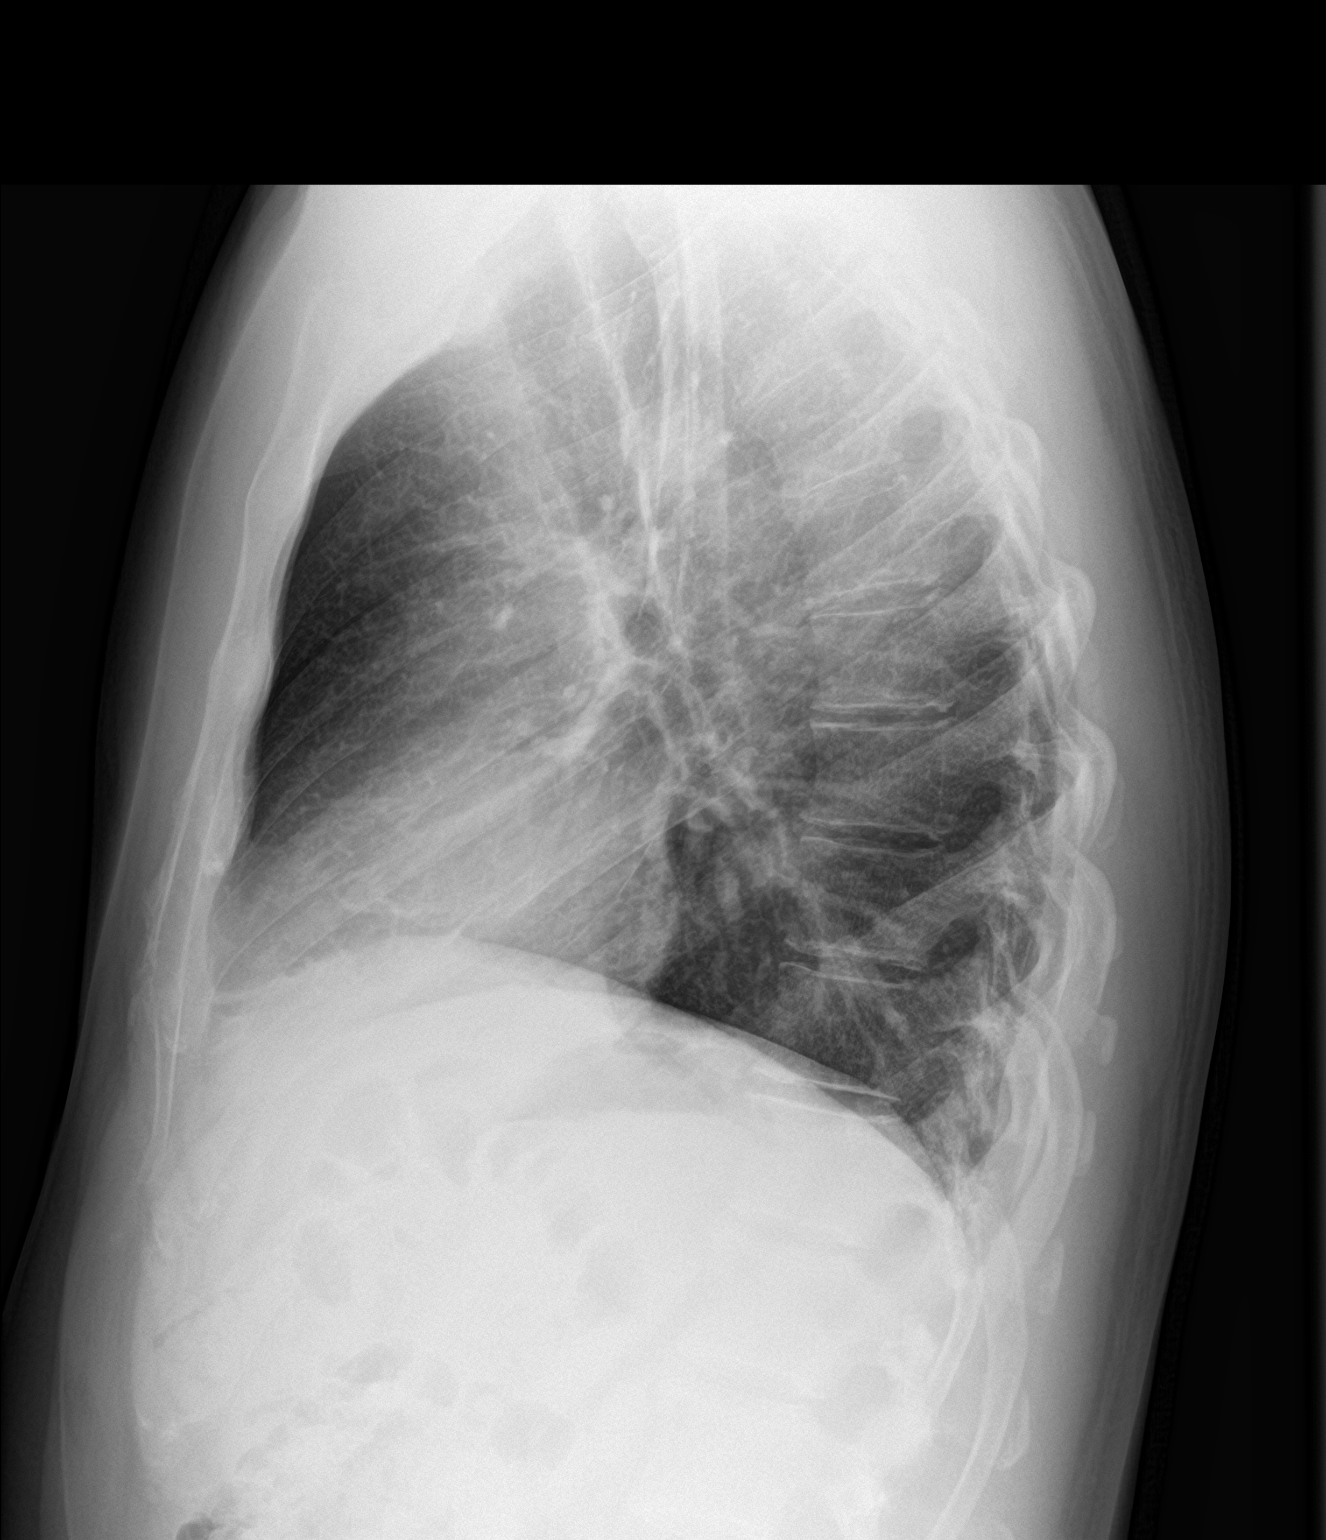

[2 of 2 positions shown; findings below may reference images not displayed]

FINDINGS: Mediastinum hilar structures normal. Low lung volumes with mild
bibasilar subsegmental atelectasis. No pleural effusion or
pneumothorax. Mild scoliosis thoracic spine. No acute bony
abnormality.
IMPRESSION: Low lung volumes with mild bibasilar subsegmental atelectasis. No
acute bony abnormality. No pneumothorax.

## 2023-02-11 ENCOUNTER — Encounter: Payer: Managed Care, Other (non HMO) | Admitting: Family Medicine

## 2023-02-20 NOTE — Progress Notes (Unsigned)
HPI: [name] is a 32 y.o.male with past medical history significant for seasonal allergies and hyperlipidemia here today for his routine physical examination.  Last CPE: 09/20/21.  He reports no new health issues since his last visit.  He is engaging in regular physical activities such as weekly runs, evening walks, and performing outdoor chores.  He adheres to a generally healthy, low-fat diet, ensuring daily vegetable intake and avoiding fried foods. He reports an average sleep duration of 7-8 hours per night.  He denies any smoking habits and limits his alcohol consumption to weekends, with a preference for beer or vodka.  Regular dental check-ups are part of his health maintenance routine.  Health Maintenance  Topic Date Due   Flu Shot  03/08/2023*   COVID-19 Vaccine (3 - 2023-24 season) 03/08/2023*   DTaP/Tdap/Td vaccine (2 - Td or Tdap) 07/09/2028   Hepatitis C Screening: USPSTF Recommendation to screen - Ages 33-79 yo.  Completed   HIV Screening  Completed   HPV Vaccine  Aged Out  *Topic was postponed. The date shown is not the original due date.   Immunization History  Administered Date(s) Administered   Influenza, Quadrivalent, Recombinant, Inj, Pf 10/22/2019   Influenza,inj,Quad PF,6+ Mos 09/28/2017, 09/20/2021   Influenza-Unspecified 09/13/2018   PFIZER(Purple Top)SARS-COV-2 Vaccination 03/06/2020, 03/24/2020   Tdap 07/09/2018   HLD: He is on Lovastatin 20 mg daily. He is tolerating medication well, no side effects reported.  4 years ago TC 245 and TG 291. Lab Results  Component Value Date   CHOL 184 05/09/2022   HDL 46.10 05/09/2022   LDLCALC 112 (H) 05/09/2022   LDLDIRECT 162.0 09/27/2018   TRIG 128.0 05/09/2022   CHOLHDL 4 05/09/2022   Lab Results  Component Value Date   HGBA1C 5.4 10/04/2021   Review of Systems  Constitutional:  Negative for activity change, appetite change and fever.  HENT:  Negative for nosebleeds, sore throat and  trouble swallowing.   Eyes:  Negative for redness and visual disturbance.  Respiratory:  Negative for cough, shortness of breath and wheezing.   Cardiovascular:  Negative for chest pain, palpitations and leg swelling.  Gastrointestinal:  Negative for abdominal pain, blood in stool, nausea and vomiting.  Endocrine: Negative for cold intolerance, heat intolerance, polydipsia, polyphagia and polyuria.  Genitourinary:  Negative for decreased urine volume, dysuria, genital sores, hematuria and testicular pain.  Musculoskeletal:  Negative for gait problem and myalgias.  Skin:  Negative for color change and rash.  Allergic/Immunologic: Positive for environmental allergies.  Neurological:  Negative for syncope, weakness and headaches.  Hematological:  Negative for adenopathy. Does not bruise/bleed easily.  Psychiatric/Behavioral:  Negative for confusion. The patient is not nervous/anxious.    Current Outpatient Medications on File Prior to Visit  Medication Sig Dispense Refill   lovastatin (MEVACOR) 20 MG tablet Take 1 tablet (20 mg total) by mouth at bedtime. 90 tablet 2   Multiple Vitamins-Minerals (ZINC PO) Take by mouth daily.     VITAMIN D PO Take by mouth daily.     No current facility-administered medications on file prior to visit.   Past Medical History:  Diagnosis Date   Allergy    Hyperlipidemia    Past Surgical History:  Procedure Laterality Date   FUNCTIONAL ENDOSCOPIC SINUS SURGERY  2014   No Known Allergies  Family History  Problem Relation Age of Onset   Hypertension Mother    Social History   Socioeconomic History   Marital status: Married  Spouse name: Not on file   Number of children: Not on file   Years of education: Not on file   Highest education level: Not on file  Occupational History   Not on file  Tobacco Use   Smoking status: Never   Smokeless tobacco: Never  Vaping Use   Vaping Use: Never used  Substance and Sexual Activity   Alcohol use: No     Comment: Social-mix of beer,wine, or liquor   Drug use: No   Sexual activity: Yes    Partners: Female  Other Topics Concern   Not on file  Social History Narrative   Not on file   Social Determinants of Health   Financial Resource Strain: Not on file  Food Insecurity: Not on file  Transportation Needs: Not on file  Physical Activity: Not on file  Stress: Not on file  Social Connections: Not on file   Vitals:   02/23/23 0705  BP: 120/70  Pulse: 67  Resp: 12  Temp: 97.9 F (36.6 C)  SpO2: 98%   Body mass index is 30.75 kg/m.  Wt Readings from Last 3 Encounters:  02/23/23 208 lb 4 oz (94.5 kg)  09/20/21 205 lb 9.6 oz (93.3 kg)  06/11/21 207 lb (93.9 kg)   Physical Exam Vitals and nursing note reviewed.  Constitutional:      General: He is not in acute distress.    Appearance: He is well-developed.  HENT:     Head: Atraumatic.     Right Ear: Tympanic membrane and external ear normal.     Left Ear: Tympanic membrane, ear canal and external ear normal.     Ears:     Comments: Excess cerumen in right eye ear canal, still can see TM. Eyes:     Conjunctiva/sclera: Conjunctivae normal.     Pupils: Pupils are equal, round, and reactive to light.  Neck:     Thyroid: No thyromegaly.     Trachea: No tracheal deviation.  Cardiovascular:     Rate and Rhythm: Normal rate and regular rhythm.     Pulses:          Dorsalis pedis pulses are 2+ on the right side and 2+ on the left side.     Heart sounds: No murmur heard. Pulmonary:     Effort: Pulmonary effort is normal. No respiratory distress.     Breath sounds: Normal breath sounds.  Abdominal:     Palpations: Abdomen is soft. There is no hepatomegaly or mass.     Tenderness: There is no abdominal tenderness.  Genitourinary:    Comments: No concerns. Musculoskeletal:        General: No tenderness.     Cervical back: Normal range of motion.     Comments: No major deformities appreciated and no signs of synovitis.   Lymphadenopathy:     Cervical: No cervical adenopathy.     Upper Body:     Right upper body: No supraclavicular adenopathy.     Left upper body: No supraclavicular adenopathy.  Skin:    General: Skin is warm.     Findings: No erythema.  Neurological:     Mental Status: He is alert and oriented to person, place, and time.     Cranial Nerves: No cranial nerve deficit.     Sensory: No sensory deficit.     Coordination: Coordination normal.     Gait: Gait normal.     Deep Tendon Reflexes:     Reflex Scores:  Bicep reflexes are 2+ on the right side and 2+ on the left side.      Patellar reflexes are 2+ on the right side and 2+ on the left side.  ASSESSMENT AND PLAN:  Mr.Johntae was seen today for annual exam.  Diagnoses and all orders for this visit: Orders Placed This Encounter  Procedures   Comprehensive metabolic panel   Lipid panel   Lab Results  Component Value Date   CHOL 164 02/23/2023   HDL 48.20 02/23/2023   LDLCALC 94 02/23/2023   LDLDIRECT 162.0 09/27/2018   TRIG 107.0 02/23/2023   CHOLHDL 3 02/23/2023   Lab Results  Component Value Date   CREATININE 0.97 02/23/2023   BUN 16 02/23/2023   NA 140 02/23/2023   K 4.3 02/23/2023   CL 104 02/23/2023   CO2 28 02/23/2023   Lab Results  Component Value Date   ALT 26 02/23/2023   AST 21 02/23/2023   ALKPHOS 36 (L) 02/23/2023   BILITOT 0.6 02/23/2023   Routine general medical examination at a health care facility Assessment & Plan: We discussed the importance of regular physical activity and healthy diet for prevention of chronic illness and/or complications. Preventive guidelines reviewed. Vaccination up to date. Next CPE in a year.   Hyperlipidemia, mixed Assessment & Plan: Continue lovastatin 20 mg daily and low-fat diet. Further recommendation will be given according to lipid panel results.  Orders: -     Comprehensive metabolic panel; Future -     Lipid panel; Future  Screening for endocrine,  metabolic and immunity disorder -     Comprehensive metabolic panel; Future  Return in 1 year (on 02/23/2024) for CPE.  Verneda Hollopeter G. Martin[place], MD  [facility]. [place] office.

## 2023-02-23 ENCOUNTER — Ambulatory Visit (INDEPENDENT_AMBULATORY_CARE_PROVIDER_SITE_OTHER): Payer: Managed Care, Other (non HMO) | Admitting: Family Medicine

## 2023-02-23 ENCOUNTER — Encounter: Payer: Self-pay | Admitting: Family Medicine

## 2023-02-23 VITALS — BP 120/70 | HR 67 | Temp 97.9°F | Resp 12 | Ht 69.0 in | Wt 208.2 lb

## 2023-02-23 DIAGNOSIS — Z130 Encounter for screening for diseases of the blood and blood-forming organs and certain disorders involving the immune mechanism: Secondary | ICD-10-CM

## 2023-02-23 DIAGNOSIS — Z13228 Encounter for screening for other metabolic disorders: Secondary | ICD-10-CM

## 2023-02-23 DIAGNOSIS — E782 Mixed hyperlipidemia: Secondary | ICD-10-CM

## 2023-02-23 DIAGNOSIS — Z0000 Encounter for general adult medical examination without abnormal findings: Secondary | ICD-10-CM

## 2023-02-23 DIAGNOSIS — Z1329 Encounter for screening for other suspected endocrine disorder: Secondary | ICD-10-CM | POA: Diagnosis not present

## 2023-02-23 LAB — COMPREHENSIVE METABOLIC PANEL
ALT: 26 U/L (ref 0–53)
AST: 21 U/L (ref 0–37)
Albumin: 4.3 g/dL (ref 3.5–5.2)
Alkaline Phosphatase: 36 U/L — ABNORMAL LOW (ref 39–117)
BUN: 16 mg/dL (ref 6–23)
CO2: 28 mEq/L (ref 19–32)
Calcium: 9.2 mg/dL (ref 8.4–10.5)
Chloride: 104 mEq/L (ref 96–112)
Creatinine, Ser: 0.97 mg/dL (ref 0.40–1.50)
GFR: 102.69 mL/min (ref >60.00)
Glucose, Bld: 99 mg/dL (ref 70–99)
Potassium: 4.3 mEq/L (ref 3.5–5.1)
Sodium: 140 mEq/L (ref 135–145)
Total Bilirubin: 0.6 mg/dL (ref 0.2–1.2)
Total Protein: 6.8 g/dL (ref 6.0–8.3)

## 2023-02-23 LAB — LIPID PANEL
Cholesterol: 164 mg/dL (ref 0–200)
HDL: 48.2 mg/dL (ref >39.00)
LDL Cholesterol: 94 mg/dL (ref 0–99)
NonHDL: 115.78
Total CHOL/HDL Ratio: 3
Triglycerides: 107 mg/dL (ref 0.0–149.0)
VLDL: 21.4 mg/dL (ref 0.0–40.0)

## 2023-02-23 MED ORDER — LOVASTATIN 20 MG PO TABS: 20.0000 mg | ORAL_TABLET | Freq: Every day | ORAL | 3 refills | Status: DC

## 2023-02-23 NOTE — Patient Instructions (Addendum)
A few things to remember from today's visit:  Routine general medical examination at a health care facility  Hyperlipidemia, mixed - Plan: Comprehensive metabolic panel, Lipid panel  Screening for endocrine, metabolic and immunity disorder - Plan: Comprehensive metabolic panel  If you need refills for medications you take chronically, please call your pharmacy. Do not use My Chart to request refills or for acute issues that need immediate attention. If you send a my chart message, it may take a few days to be addressed, specially if I am not in the office.  Please be sure medication list is accurate. If a new problem present, please set up appointment sooner than planned today.  Health Maintenance, Male Adopting a healthy lifestyle and getting preventive care are important in promoting health and wellness. Ask your health care provider about: The right schedule for you to have regular tests and exams. Things you can do on your own to prevent diseases and keep yourself healthy. What should I know about diet, weight, and exercise? Eat a healthy diet  Eat a diet that includes plenty of vegetables, fruits, low-fat dairy products, and lean protein. Do not eat a lot of foods that are high in solid fats, added sugars, or sodium. Maintain a healthy weight Body mass index (BMI) is a measurement that can be used to identify possible weight problems. It estimates body fat based on height and weight. Your health care provider can help determine your BMI and help you achieve or maintain a healthy weight. Get regular exercise Get regular exercise. This is one of the most important things you can do for your health. Most adults should: Exercise for at least 150 minutes each week. The exercise should increase your heart rate and make you sweat (moderate-intensity exercise). Do strengthening exercises at least twice a week. This is in addition to the moderate-intensity exercise. Spend less time sitting.  Even light physical activity can be beneficial. Watch cholesterol and blood lipids Have your blood tested for lipids and cholesterol at 34 years of age, then have this test every 5 years. You may need to have your cholesterol levels checked more often if: Your lipid or cholesterol levels are high. You are older than 34 years of age. You are at high risk for heart disease. What should I know about cancer screening? Many types of cancers can be detected early and may often be prevented. Depending on your health history and family history, you may need to have cancer screening at various ages. This may include screening for: Colorectal cancer. Prostate cancer. Skin cancer. Lung cancer. What should I know about heart disease, diabetes, and high blood pressure? Blood pressure and heart disease High blood pressure causes heart disease and increases the risk of stroke. This is more likely to develop in people who have high blood pressure readings or are overweight. Talk with your health care provider about your target blood pressure readings. Have your blood pressure checked: Every 3-5 years if you are 13-42 years of age. Every year if you are 48 years old or older. If you are between the ages of 22 and 60 and are a current or former smoker, ask your health care provider if you should have a one-time screening for abdominal aortic aneurysm (AAA). Diabetes Have regular diabetes screenings. This checks your fasting blood sugar level. Have the screening done: Once every three years after age 54 if you are at a normal weight and have a low risk for diabetes. More often and at  a younger age if you are overweight or have a high risk for diabetes. What should I know about preventing infection? Hepatitis B If you have a higher risk for hepatitis B, you should be screened for this virus. Talk with your health care provider to find out if you are at risk for hepatitis B infection. Hepatitis C Blood  testing is recommended for: Everyone born from [birth year] through 1967. Anyone with known risk factors for hepatitis C. Sexually transmitted infections (STIs) You should be screened each year for STIs, including gonorrhea and chlamydia, if: You are sexually active and are younger than 34 years of age. You are older than 34 years of age and your health care provider tells you that you are at risk for this type of infection. Your sexual activity has changed since you were last screened, and you are at increased risk for chlamydia or gonorrhea. Ask your health care provider if you are at risk. Ask your health care provider about whether you are at high risk for HIV. Your health care provider may recommend a prescription medicine to help prevent HIV infection. If you choose to take medicine to prevent HIV, you should first get tested for HIV. You should then be tested every 3 months for as long as you are taking the medicine. Follow these instructions at home: Alcohol use Do not drink alcohol if your health care provider tells you not to drink. If you drink alcohol: Limit how much you have to 0-2 drinks a day. Know how much alcohol is in your drink. In the [place] one drink equals one 12 oz bottle of beer (355 mL), one 5 oz glass of wine (148 mL), or one 1 oz glass of hard liquor (44 mL). Lifestyle Do not use any products that contain nicotine or tobacco. These products include cigarettes, chewing tobacco, and vaping devices, such as e-cigarettes. If you need help quitting, ask your health care provider. Do not use street drugs. Do not share needles. Ask your health care provider for help if you need support or information about quitting drugs. General instructions Schedule regular health, dental, and eye exams. Stay current with your vaccines. Tell your health care provider if: You often feel depressed. You have ever been abused or do not feel safe at home. Summary Adopting a healthy lifestyle and  getting preventive care are important in promoting health and wellness. Follow your health care provider's instructions about healthy diet, exercising, and getting tested or screened for diseases. Follow your health care provider's instructions on monitoring your cholesterol and blood pressure. This information is not intended to replace advice given to you by your health care provider. Make sure you discuss any questions you have with your health care provider. Document Revised: 04/15/2021 Document Reviewed: 04/15/2021 Elsevier Patient Education  [place].

## 2023-02-23 NOTE — Assessment & Plan Note (Signed)
We discussed the importance of regular physical activity and healthy diet for prevention of chronic illness and/or complications. Preventive guidelines reviewed. Vaccination up-to-date. Next CPE in a year. 

## 2023-02-23 NOTE — Assessment & Plan Note (Signed)
Continue lovastatin 20 mg daily and low-fat diet. Further recommendation will be given according to lipid panel results.

## 2023-02-23 NOTE — Addendum Note (Signed)
Addended by: Martin[place], Mayford Alberg G on: 02/23/2023 01:25 PM   Modules accepted: Orders

## 2023-04-21 ENCOUNTER — Encounter: Payer: Self-pay | Admitting: Family Medicine

## 2023-04-25 ENCOUNTER — Other Ambulatory Visit: Payer: Self-pay | Admitting: Family Medicine

## 2023-04-29 ENCOUNTER — Encounter: Payer: Self-pay | Admitting: Family Medicine

## 2023-04-29 DIAGNOSIS — L237 Allergic contact dermatitis due to plants, except food: Secondary | ICD-10-CM

## 2023-05-01 ENCOUNTER — Encounter (INDEPENDENT_AMBULATORY_CARE_PROVIDER_SITE_OTHER): Payer: Managed Care, Other (non HMO) | Admitting: Family Medicine

## 2023-05-01 ENCOUNTER — Encounter: Payer: Self-pay | Admitting: Family Medicine

## 2023-05-01 DIAGNOSIS — L237 Allergic contact dermatitis due to plants, except food: Secondary | ICD-10-CM

## 2023-05-01 MED ORDER — CLOBETASOL PROPIONATE 0.05 % EX CREA: 1.0000 | TOPICAL_CREAM | Freq: Two times a day (BID) | CUTANEOUS | 0 refills | Status: DC

## 2023-05-01 NOTE — Telephone Encounter (Signed)
Please see the MyChart message reply(ies) for my assessment and plan.  The patient gave consent for this Medical Advice Message and is aware that it may result in a bill to their insurance company as well as the possibility that this may result in a co-payment or deductible. They are an established patient, but are not seeking medical advice exclusively about a problem treated during an in person or video visit in the last 7 days. I did not recommend an in person or video visit within 7 days of my reply. Communication started 04/29/23, recommendations given on 05/01/23. [name] sent another message with pictures, cannot come to the office. On 04/29/23 he reported very pruritic rash noted after mowing. On 05/01/2023 I recommend OTC Zyrtec 10 mg daily, Pepcid 20 mg twice daily for 14 days, and topical cortisone 2-3 times per day for up to 14 days. Picture showed what seem to be confluent microvesicular and papular erythematous rash around on anterior aspect of ankle. Possible poison ivy. 1. Contact dermatitis due to poison ivy - clobetasol cream (TEMOVATE) 0.05 %; Apply 1 Application topically 2 (two) times daily. 14 days then prn.  Dispense: 30 g; Refill: 0  Will send prescription for clobetasol to use small amount twice daily for 14 days then as needed. He still needs to follow if problem gets worse.  I spent a total of 14 minutes cumulative time within 7 days through [name], MD

## 2023-09-10 ENCOUNTER — Encounter: Payer: Self-pay | Admitting: Family Medicine

## 2023-09-11 ENCOUNTER — Ambulatory Visit (INDEPENDENT_AMBULATORY_CARE_PROVIDER_SITE_OTHER): Payer: Managed Care, Other (non HMO) | Admitting: Nurse Practitioner

## 2023-09-11 VITALS — BP 128/84 | HR 82 | Temp 98.3°F | Ht 69.0 in | Wt 202.4 lb

## 2023-09-11 DIAGNOSIS — R0683 Snoring: Secondary | ICD-10-CM | POA: Diagnosis not present

## 2023-09-11 DIAGNOSIS — R030 Elevated blood-pressure reading, without diagnosis of hypertension: Secondary | ICD-10-CM | POA: Insufficient documentation

## 2023-09-11 DIAGNOSIS — B9689 Other specified bacterial agents as the cause of diseases classified elsewhere: Secondary | ICD-10-CM | POA: Insufficient documentation

## 2023-09-11 DIAGNOSIS — J208 Acute bronchitis due to other specified organisms: Secondary | ICD-10-CM | POA: Diagnosis not present

## 2023-09-11 MED ORDER — AMOXICILLIN-POT CLAVULANATE 875-125 MG PO TABS: 1.0000 | ORAL_TABLET | Freq: Two times a day (BID) | ORAL | 0 refills | Status: DC

## 2023-09-11 MED ORDER — PROMETHAZINE-DM 6.25-15 MG/5ML PO SYRP: 5.0000 mL | ORAL_SOLUTION | Freq: Four times a day (QID) | ORAL | 0 refills | Status: DC | PRN

## 2023-09-11 NOTE — Assessment & Plan Note (Signed)
Chronic Patient does have risk factors for sleep apnea including being overweight, severe snoring, and blood pressure that is trending upward.  Will refer to [facility] for further evaluation and to determine whether or not he would be a candidate for sleep study.

## 2023-09-11 NOTE — Assessment & Plan Note (Signed)
Acute, vital signs stable Overall symptoms remain mild. Treat with course of Augmentin x 10 days.  Continue as needed Mucinex and Allegra.  Will add in promethazine dextromethorphan cough syrup to take at night as needed.  He was told to avoid driving or operating heavy machinery when taking the cough syrup.  He reports his understanding. If symptoms persist despite the above treatment plan patient was educated to proceed to his primary care provider's office to discuss further evaluation and possible chest x-ray.  He reports his understanding.

## 2023-09-11 NOTE — Progress Notes (Signed)
Established Patient Office Visit  Subjective   Patient ID: Gabriel Warner, male    DOB: 1990-12-11  Age: 32 y.o. MRN: [id number]  Chief Complaint  Patient presents with   Nasal Congestion    Been going for over the past two weeks Expel mucus both through cough or nasal, ( dark yellow to yellowish green) pt stated he tried over the counter medication like zyrtec, mucinex and allegra and none has helped     Mr. Italiano is a 32 y/o male with PMH of snoring and hyperlipidemia.  His main concern is that he has had a productive cough for about 2 and half weeks.  Reports that he has 3 children all of which are in daycare and they have had similar symptoms as well.  He reports about 1 week into his symptom onset he took a COVID test and this was negative.  Reports having repeated at another time which was also negative.  He does feel fatigued, but is not having significant shortness of breath or wheezing.  Has been using Mucinex and Allegra at home with mild symptom improvement.  He also reports that he has been snoring, and he is concerned he may have sleep apnea.  He has noticed at home blood pressure readings have been trending upward with high systolic of 133.  His BMI is in the overweight range at 29.8.  He reports having undergone sleep study about 4 years ago and was found not to have sleep apnea at that time.    Review of Systems  Constitutional:  Positive for malaise/fatigue. Negative for chills and fever.  Respiratory:  Positive for cough and sputum production. Negative for shortness of breath and wheezing.   Cardiovascular:  Negative for chest pain.  Musculoskeletal:  Negative for myalgias.  Neurological:  Negative for headaches.      Objective:     BP 128/84   Pulse 82   Temp 98.3 F (36.8 C) (Temporal)   Ht 5\' 9"  (1.753 m)   Wt 202 lb 6 oz (91.8 kg)   SpO2 97%   BMI 29.89 kg/m  BP Readings from Last 3 Encounters:  09/11/23 128/84  02/23/23 120/70  09/20/21 116/78    Wt Readings from Last 3 Encounters:  09/11/23 202 lb 6 oz (91.8 kg)  02/23/23 208 lb 4 oz (94.5 kg)  09/20/21 205 lb 9.6 oz (93.3 kg)      Physical Exam Vitals reviewed.  Constitutional:      Appearance: Normal appearance.  HENT:     Head: Normocephalic and atraumatic.  Cardiovascular:     Rate and Rhythm: Normal rate and regular rhythm.  Pulmonary:     Effort: Pulmonary effort is normal.     Breath sounds: Normal breath sounds.  Musculoskeletal:     Cervical back: Neck supple.  Skin:    General: Skin is warm and dry.  Neurological:     Mental Status: He is alert and oriented to person, place, and time.  Psychiatric:        Mood and Affect: Mood normal.        Behavior: Behavior normal.        Thought Content: Thought content normal.        Judgment: Judgment normal.      No results found for any visits on 09/11/23.    The ASCVD Risk score (Arnett DK, et al., 2019) failed to calculate for the following reasons:   The 2019 ASCVD risk score is only  valid for ages 41 to 7    Assessment & Plan:   Problem List Items Addressed This Visit       Respiratory   Acute bacterial bronchitis - Primary    Acute, vital signs stable Overall symptoms remain mild. Treat with course of Augmentin x 10 days.  Continue as needed Mucinex and Allegra.  Will add in promethazine dextromethorphan cough syrup to take at night as needed.  He was told to avoid driving or operating heavy machinery when taking the cough syrup.  He reports his understanding. If symptoms persist despite the above treatment plan patient was educated to proceed to his primary care provider's office to discuss further evaluation and possible chest x-ray.  He reports his understanding.      Relevant Medications   amoxicillin-clavulanate (AUGMENTIN) 875-125 MG tablet   promethazine-dextromethorphan (PROMETHAZINE-DM) 6.25-15 MG/5ML syrup     Other   Snoring    Chronic Patient does have risk factors for sleep  apnea including being overweight, severe snoring, and blood pressure that is trending upward.  Will refer to [facility] for further evaluation and to determine whether or not he would be a candidate for sleep study.      Relevant Orders   Ambulatory referral to Neurology   Elevated BP without diagnosis of hypertension    Per chart review blood pressures have been trending upward over the last 2 years.  For now I recommend modest weight loss of 5% and had trial of the DASH diet to see if this helps manage blood pressure without initiating on an antihypertensive at this time.  He reports his understanding.       Return if symptoms worsen or fail to improve.    Elenore Paddy, NP

## 2023-09-11 NOTE — Assessment & Plan Note (Signed)
Per chart review blood pressures have been trending upward over the last 2 years.  For now I recommend modest weight loss of 5% and had trial of the DASH diet to see if this helps manage blood pressure without initiating on an antihypertensive at this time.  He reports his understanding.

## 2023-10-12 ENCOUNTER — Encounter: Payer: Self-pay | Admitting: Neurology

## 2023-10-12 ENCOUNTER — Ambulatory Visit (INDEPENDENT_AMBULATORY_CARE_PROVIDER_SITE_OTHER): Payer: Managed Care, Other (non HMO) | Admitting: Neurology

## 2023-10-12 VITALS — BP 120/80 | HR 74 | Ht 69.0 in | Wt 200.0 lb

## 2023-10-12 DIAGNOSIS — R0683 Snoring: Secondary | ICD-10-CM | POA: Diagnosis not present

## 2023-10-12 DIAGNOSIS — G478 Other sleep disorders: Secondary | ICD-10-CM | POA: Insufficient documentation

## 2023-10-12 MED ORDER — MOMETASONE FUROATE 50 MCG/ACT NA SUSP: 2.0000 | Freq: Every day | NASAL | 12 refills | Status: AC

## 2023-10-12 NOTE — Patient Instructions (Signed)
ASSESSMENT AND PLAN:  32 -year-old male here with:    1) Loud snoring, loudest in supine sleep position.   2) nasal congestion - recent bronchitis, no history of pneumonia.   3) BMI has not changed since last HST.   I will repeat a HST, and by watch pat.  I am urging the patient to use a nasal  spray to prepare for better airflow  before HST.  I plan to follow up either personally or through our NP within 3-5 months.   I would like to thank [place], Betty G, MD and Elenore Paddy, Np [address] [place],  [place] [address] for allowing me to meet with and to take care of this pleasant patient.    After spending a total time of  35  minutes face to face and additional time for physical and neurologic examination, review of laboratory studies,  personal review of imaging studies, reports and results of other testing and review of referral information / records as far as provided in visit,   Electronically signed by: Melvyn Novas, MD 10/12/2023 10:19 AM

## 2023-10-12 NOTE — Progress Notes (Addendum)
SLEEP MEDICINE CLINIC    Provider:  Melvyn Novas, MD  Primary Care Physician:  [place], Betty G, MD [address] [place] [address]     Referring Provider: Elenore Paddy, Np [address] [place],  [place] [address]          Chief Complaint according to patient   Patient presents with:     New SLEEP Patient (Initial Visit)           HISTORY OF PRESENT ILLNESS:  Gabriel Warner is a 32 y.o. male patient who is seen upon referral by PCP  on 10/12/2023  for a sleep medicine evaluation.  Chief concern according to patient : had recent bronchitis, had septoplasty in 2014. Fatigue, loud snoring per wife's observation, non-restorative".  Affecting work, family life.     I have the pleasure of seeing Gabriel Warner on 10/12/23 a left-handed male with a possible sleep disorder.  Had a sleep study in 2019, negative for apnea. HST. Unclear on what device - his sleep study results did lead to not being able to get a mouth piece covered ( seen by Irene Limbo, DDS) .     Sleep relevant medical history: Nocturia: 0-1, no Tonsillectomy, deviated septum repair, no neck surgery- Concussion a sports, wisdom teeth.    Family medical /sleep history: father on CPAP with OSA.   Social history:  Patient is working as an Chartered certified accountant  and lives in a household with spouse and 3 children under 5-  a dog, one cat.   The patient currently works regular office hours, used to work shifts 12 on 12 off,2017. Tobacco use: none .  ETOH use : 1-2 / week,  Caffeine intake in form of Coffee( 4 in AM ) Soda( /) Tea ( /) no energy drinks Routine Exercise ; running , 3 times a week.  Sleep habits are as follows: The patient's dinner time is between 5.30 PM. The patient goes to bed at 10 PM. Cool and quiet and dark.  He continues to sleep for 7 hours, wakes for 0-1 bathroom breaks, the first time at 2-3 AM.   The preferred sleep position is supine, with the support of 1-2 pillows. He  snores loudly in that position. Dreams are reportedly infrequent. The patient wakes up with an alarm. 6.45- 7  AM is the usual rise time. He  reports not feeling refreshed or restored in AM ( for a decade) , with symptoms such as dry mouth, and residual fatigue.  Naps are taken infrequently,  every 2 months at most- lasting from 60  minutes ,no more  refreshing than nocturnal sleep.    Review of Systems: Out of a complete 14 system review, the patient complains of only the following symptoms, and all other reviewed systems are negative.:  Fatigue, sleepiness , loud snoring, fragmented sleep, Insomnia, cold feet.    How likely are you to doze in the following situations: 0 = not likely, 1 = slight chance, 2 = moderate chance, 3 = high chance   Sitting and Reading? Watching Television? Sitting inactive in a public place (theater or meeting)? As a passenger in a car for an hour without a break? Lying down in the afternoon when circumstances permit? Sitting and talking to someone? Sitting quietly after lunch without alcohol? In a car, while stopped for a few minutes in traffic?   Total = 7/ 24 points   FSS endorsed at 38/ 63 points.  Social History   Socioeconomic History   Marital status: Married    Spouse name: Not on file   Number of children: Not on file   Years of education: Not on file   Highest education level: Not on file  Occupational History   Not on file  Tobacco Use   Smoking status: Never   Smokeless tobacco: Never  Vaping Use   Vaping status: Never Used  Substance and Sexual Activity   Alcohol use: No    Comment: Social-mix of beer,wine, or liquor   Drug use: No   Sexual activity: Yes    Partners: Female  Other Topics Concern   Not on file  Social History Narrative   Not on file   Social Determinants of Health   Financial Resource Strain: Not on file  Food Insecurity: Not on file  Transportation Needs: Not on file  Physical Activity: Not on file   Stress: Not on file  Social Connections: Unknown (04/22/2022)   [facility] from [facility]   Social Network    Social Network: Not on file    Family History  Problem Relation Age of Onset   Hypertension Mother     Past Medical History:  Diagnosis Date   Allergy    Hyperlipidemia     Past Surgical History:  Procedure Laterality Date   FUNCTIONAL ENDOSCOPIC SINUS SURGERY  2014   WISDOM TOOTH EXTRACTION       Current Outpatient Medications on File Prior to Visit  Medication Sig Dispense Refill   lovastatin (MEVACOR) 20 MG tablet TAKE 1 TABLET(20 MG) BY MOUTH AT BEDTIME 90 tablet 3   Multiple Vitamins-Minerals (ZINC PO) Take by mouth daily. (Patient not taking: Reported on 10/12/2023)     VITAMIN D PO Take by mouth daily. (Patient not taking: Reported on 10/12/2023)     No current facility-administered medications on file prior to visit.    No Known Allergies   DIAGNOSTIC DATA (LABS, IMAGING, TESTING) - I reviewed patient records, labs, notes, testing and imaging myself where available.  Lab Results  Component Value Date   WBC 5.9 09/28/2017   HGB 16.1 09/28/2017   HCT 47.1 09/28/2017   MCV 90.3 09/28/2017   PLT 193.0 09/28/2017      Component Value Date/Time   NA 140 02/23/2023 0852   K 4.3 02/23/2023 0852   CL 104 02/23/2023 0852   CO2 28 02/23/2023 0852   GLUCOSE 99 02/23/2023 0852   BUN 16 02/23/2023 0852   CREATININE 0.97 02/23/2023 0852   CREATININE 0.98 08/10/2020 0734   CALCIUM 9.2 02/23/2023 0852   PROT 6.8 02/23/2023 0852   ALBUMIN 4.3 02/23/2023 0852   AST 21 02/23/2023 0852   ALT 26 02/23/2023 0852   ALKPHOS 36 (L) 02/23/2023 0852   BILITOT 0.6 02/23/2023 0852   GFRNONAA 103 08/10/2020 0734   GFRAA 119 08/10/2020 0734   Lab Results  Component Value Date   CHOL 164 02/23/2023   HDL 48.20 02/23/2023   LDLCALC 94 02/23/2023   LDLDIRECT 162.0 09/27/2018   TRIG 107.0 02/23/2023   CHOLHDL 3 02/23/2023   Lab Results   Component Value Date   HGBA1C 5.4 10/04/2021   No results found for: "VITAMINB12" Lab Results  Component Value Date   TSH 1.42 10/04/2021    PHYSICAL EXAM:  Today's Vitals   10/12/23 0932  BP: 120/80  Pulse: 74  Weight: 200 lb (90.7 kg)  Height: 5\' 9"  (1.753 m)   Body mass  index is 29.53 kg/m.   Wt Readings from Last 3 Encounters:  10/12/23 200 lb (90.7 kg)  09/11/23 202 lb 6 oz (91.8 kg)  02/23/23 208 lb 4 oz (94.5 kg)     Ht Readings from Last 3 Encounters:  10/12/23 5\' 9"  (1.753 m)  09/11/23 5\' 9"  (1.753 m)  02/23/23 5\' 9"  (1.753 m)      General: The patient is awake, alert and appears not in acute distress. The patient is well groomed. Head: Normocephalic, atraumatic. Neck is supple.  Mallampati 1-2,  neck circumference:16 inches . Nasal airflow only partially patent.  Retrognathia is not seen. Had wisdom teeth removed. Bruxism marks.  Dental status: biological Cardiovascular:  Regular rate and cardiac rhythm by pulse, without distended neck veins. Respiratory: Lungs are clear to auscultation.  Skin:  Without evidence of ankle edema, or rash. Trunk: The patient's posture is erect.   NEUROLOGIC EXAM: The patient is awake and alert, oriented to place and time.   Memory subjective described as intact.  Attention span & concentration ability appears normal.  Speech is fluent,  without  dysarthria, dysphonia or aphasia.  Mood and affect are appropriate.   Cranial nerves: no loss of smell or taste reported  Pupils are equal and briskly reactive to light.  Funduscopic exam deferred.  Extraocular movements in vertical and horizontal planes were intact and without nystagmus. No Diplopia. Visual fields by finger perimetry are intact. Hearing was intact to soft voice and finger rubbing.    Facial sensation intact to fine touch.  Facial motor strength is symmetric and tongue and uvula move midline.  Neck ROM : rotation, tilt and flexion extension were normal for  age and shoulder shrug was symmetrical.    Motor exam:  Symmetric bulk, tone and ROM.   Normal tone without cog -wheeling, symmetric grip strength .   Sensory:  Fine touch and vibration were normal.  Proprioception tested in the upper extremities was normal.   Coordination: Rapid alternating movements in the fingers/hands were of normal speed.  The Finger-to-nose maneuver was intact without evidence of ataxia, dysmetria or tremor.   Gait and station: Patient could rise unassisted from a seated position, walked without assistive device.  Stance is of normal width/ base .  Toe and heel walk were deferred.  Deep tendon reflexes: in the  upper and lower extremities are symmetric and intact.  Babinski response was deferred.    ASSESSMENT AND PLAN:  70 -year-old male here with:    1) Loud snoring, loudest in supine sleep position.   2) nasal congestion - recent bronchitis, no history of pneumonia.   3) BMI has not changed since last HST.   I will repeat a HST, and by watch pat.  I am urging the patient to use a nasal  spray to prepare for better airflow  before HST.  I plan to follow up either personally or through our NP within 3-5 months.   I would like to thank [place], Betty G, MD and Elenore Paddy, Np [address] [place],  [place] [address] for allowing me to meet with and to take care of this pleasant patient.    After spending a total time of  35  minutes face to face and additional time for physical and neurologic examination, review of laboratory studies,  personal review of imaging studies, reports and results of other testing and review of referral information / records as far as provided in visit,   Electronically signed  by: Melvyn Novas, MD 10/12/2023 10:19 AM  [facility] Board certified by The [employer] of Sleep Medicine and Diplomate of the [employer] of Sleep Medicine. Board certified In Neurology through the  ABPN, Fellow of the [employer] of Neurology.

## 2023-10-12 NOTE — Addendum Note (Signed)
Addended by: Melvyn Novas on: 10/12/2023 10:55 AM   Modules accepted: Orders

## 2023-11-03 ENCOUNTER — Ambulatory Visit (INDEPENDENT_AMBULATORY_CARE_PROVIDER_SITE_OTHER): Payer: Managed Care, Other (non HMO) | Admitting: Neurology

## 2023-11-03 DIAGNOSIS — G4733 Obstructive sleep apnea (adult) (pediatric): Secondary | ICD-10-CM

## 2023-11-03 DIAGNOSIS — G478 Other sleep disorders: Secondary | ICD-10-CM

## 2023-11-03 DIAGNOSIS — R5382 Chronic fatigue, unspecified: Secondary | ICD-10-CM

## 2023-11-03 DIAGNOSIS — R030 Elevated blood-pressure reading, without diagnosis of hypertension: Secondary | ICD-10-CM

## 2023-11-03 DIAGNOSIS — R0683 Snoring: Secondary | ICD-10-CM

## 2023-11-11 NOTE — Progress Notes (Signed)
[name], 32 y.o., 02-01-1991 MRN: [id number] HOME SLEEP TEST REPORT ( by Watch PAT)   STUDY DATE:  11-11-2023   ORDERING CLINICIAN: Melvyn Novas, MD  REFERRING CLINICIAN: PCP, Guadalupe Maple   CLINICAL INFORMATION/HISTORY: recently suffered acute bacterial bronchitis. Chief concern according to patient : had recent bronchitis, had septoplasty in 2014. Fatigue, loud snoring per wife's observation".  Non restorative sleep is affecting his work and family life.   I have the pleasure of seeing Gabriel Warner on 10/12/23 a left-handed male with a possible sleep disorder.  Had a sleep study in 2019, negative for apnea. HST. Unclear on what device - his sleep study results did lead to not being able to get a mouth piece covered by insurance( seen by Irene Limbo, DDS) .   Epworth sleepiness score: 7/ 24 points   FSS endorsed at 38/ 63 points.    BMI: 29.5 kg/m   Neck Circumference: 16"   FINDINGS:   Sleep Summary:   Total Recording Time (hours, min):   9 hours 4 minutes  Total Sleep Time (hours, min):   8 hours 12 minutes              Percent REM (%):    36% (!)      Sleep latency was 25 minutes, REM sleep latency 59 minutes, a total of 27 minutes of wakefulness after sleep onset.                                Respiratory Indices:   Calculated pRDI (per hour):     7.5/h                        REM pRDI:   13.8/h                                              NREM pRDI: 4/h                            Supine RDI: The patient slept 343 minutes in supine position with an RDI of 9.7/h and an AHI of 6.2/h.  He slept 17 minutes on his left side with an RDI of 14.5/h and an AHI of 7.3/h.  The total nonsupine RDI was 2.4/h.  Snoring statistics are derived from a chest wall electrode that picks up vibration.  For this patient the mean volume was only 40 dB which is the threshold of detection and snoring was present for about 20% of total  sleep time.                                                 Oxygen Saturation Statistics:        O2 Saturation Range (%):      The mean oxygen saturation was 95% with a nadir at 83% and a maximum of 99%.  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  There is clearly REM sleep dependency for RDI and AHI noted.   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 In spite of the RDI being 3.5 times higher in REM sleep than in non-REM sleep , this HST showed a very high proportion of REM sleep.   The diagnosis is upper airway resistance syndrome, with very mild sleep apnea, REM dependent.   RECOMMENDATION:  Therapy through a mandibular  advancing made to measure dental device should be able to treat bruxism , hopefully reduces snoring.   We did not pick up loud snoring as has been described and witnessed by his wife.  Patients with similar low AHI/ elevated RDI results have still benefited from low pressure CPAP. I would offer this treatment if [employer] approves a device.     INTERPRETING PHYSICIAN:   Melvyn Novas, MD

## 2023-11-13 ENCOUNTER — Telehealth: Payer: Self-pay | Admitting: Family Medicine

## 2023-11-13 NOTE — Telephone Encounter (Signed)
Pt states he received a bill for $110, and he would like to know why? Pt states he spoke to someone in the Billing Department, and was told to reach out to his provider. Please return Pt's call, at your earliest convenience.

## 2023-11-16 ENCOUNTER — Encounter: Payer: Self-pay | Admitting: Neurology

## 2023-11-16 NOTE — Telephone Encounter (Signed)
LVM on pt phone to give me a call back concerning his Billing issue.   FYI.

## 2023-11-20 ENCOUNTER — Other Ambulatory Visit: Payer: Self-pay | Admitting: Neurology

## 2023-11-20 DIAGNOSIS — R0683 Snoring: Secondary | ICD-10-CM

## 2023-11-20 DIAGNOSIS — G4739 Other sleep apnea: Secondary | ICD-10-CM

## 2023-11-20 DIAGNOSIS — G478 Other sleep disorders: Secondary | ICD-10-CM

## 2023-11-20 NOTE — Procedures (Signed)
[name], 32 y.o., 02-01-1991 MRN: [id number] HOME SLEEP TEST REPORT ( by Watch PAT)   STUDY DATE:  11-11-2023   ORDERING CLINICIAN: Melvyn Novas, MD  REFERRING CLINICIAN: PCP, Guadalupe Maple   CLINICAL INFORMATION/HISTORY: recently suffered acute bacterial bronchitis. Chief concern according to patient : had recent bronchitis, had septoplasty in 2014. Fatigue, loud snoring per wife's observation".  Non restorative sleep is affecting his work and family life.   I have the pleasure of seeing Gabriel Warner on 10/12/23 a left-handed male with a possible sleep disorder.  Had a sleep study in 2019, negative for apnea. HST. Unclear on what device - his sleep study results did lead to not being able to get a mouth piece covered by insurance( seen by Irene Limbo, DDS) .   Epworth sleepiness score: 7/ 24 points   FSS endorsed at 38/ 63 points.    BMI: 29.5 kg/m   Neck Circumference: 16"   FINDINGS:   Sleep Summary:   Total Recording Time (hours, min):   9 hours 4 minutes  Total Sleep Time (hours, min):   8 hours 12 minutes              Percent REM (%):    36% (!)      Sleep latency was 25 minutes, REM sleep latency 59 minutes, a total of 27 minutes of wakefulness after sleep onset.                                Respiratory Indices:   Calculated pRDI (per hour):     7.5/h                        REM pRDI:   13.8/h                                              NREM pRDI: 4/h                            Supine RDI: The patient slept 343 minutes in supine position with an RDI of 9.7/h and an AHI of 6.2/h.  He slept 17 minutes on his left side with an RDI of 14.5/h and an AHI of 7.3/h.  The total nonsupine RDI was 2.4/h.  Snoring statistics are derived from a chest wall electrode that picks up vibration.  For this patient the mean volume was only 40 dB which is the threshold of detection and snoring was present for about 20% of total  sleep time.                                                 Oxygen Saturation Statistics:        O2 Saturation Range (%):      The mean oxygen saturation was 95% with a nadir at 83% and a maximum of 99%.  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  There is clearly REM sleep dependency for RDI and AHI noted.   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 In spite of the RDI being 3.5 times higher in REM sleep than in non-REM sleep , this HST showed a very high proportion of REM sleep.   The diagnosis is upper airway resistance syndrome, with very mild sleep apnea, REM dependent.   RECOMMENDATION:  Therapy through a mandibular  advancing made to measure dental device should be able to treat bruxism , hopefully reduces snoring.   We did not pick up loud snoring as has been described and witnessed by his wife.  Patients with similar low AHI/ elevated RDI results have still benefited from low pressure CPAP. I would offer this treatment if [employer] approves a device.     INTERPRETING PHYSICIAN:   Melvyn Novas, MD

## 2023-11-20 NOTE — Progress Notes (Signed)
I contacted the patient by phone today and spoke with him about the results of his home sleep test.  He will see Dr. Irene Limbo his sleep specialist dentist already on Monday, 16 December.  The test results are posted for him to copy and bring to his dental appointment.  At this time he is not interested in low pressure CPAP therapy. Gabriel Warner

## 2023-11-24 ENCOUNTER — Telehealth: Payer: Self-pay | Admitting: Neurology

## 2023-11-24 NOTE — Telephone Encounter (Signed)
Urgent referral sent to Dr. Irene Limbo, phone # [phone].

## 2023-12-01 NOTE — Telephone Encounter (Addendum)
On 04/29/23: Pt sent Dr. [place] a message via Mychart // Charge: $110 // Patient's Responsibility: $101.31 On 09/11/23: Acute Visit with Jiles Prows, NP @ [facility] - Charge: [phone] // Patient's Responsibility: $8.69 $101.31 + $8.69 = Total Due:  $110.00

## 2023-12-08 NOTE — Telephone Encounter (Signed)
 Pt wife called upset that pt was charged for Medical advice through a MyChart msg. Let pt know what the message stated below per Medical Advice via MyChart and pt wife was rude; and argumentative stating pt shouldn't be charged for Dr doing her job. I responded by letting her know that Dr. Jordan took the time out to respond with medical advice and prescribed him meds.  I also let her know that any medical advice given will be charged to INS. Pt wife stated $110 is to much to charge for a MyChart msg; I let her know that it was their INS price that they didn't cover. Pt stated no it's not; we should have charged pt for a virtual visit.  I let pt wife know that we would need a VV appt to coincide with a VV code; pt then began to say it was a VV appt because it was over the internet. Let pt know VV consist of Face to Face. Pt wife then began to say our office is nasty and horrible for charging for MyChart msgs even though she is aware that the pt would potentially be charged.  Gave pt the [facility] medical advice website if she needed further information.    FYI

## 2024-01-06 ENCOUNTER — Ambulatory Visit: Payer: Self-pay | Admitting: Family Medicine

## 2024-01-06 NOTE — Telephone Encounter (Signed)
Noted

## 2024-01-06 NOTE — Telephone Encounter (Signed)
Copied from CRM [id number]. Topic: Clinical - Red Word Triage >> Jan 06, 2024  9:49 AM Denese Killings wrote: Red Word that prompted transfer to Nurse Triage: Patient think he has the flu or strep  Rising temp of 99, sore throat, headache, coughing up mucus, trouble getting up,  and feels fatigue.   Chief Complaint: Strep exposure Symptoms: Sore throat, cough, headache, fatigue  Frequency: Constant  Pertinent Negatives: Patient denies fever Disposition: [] ED /[x] Urgent Care (no appt availability in office) / [] Appointment(In office/virtual)/ []  [place] [facility]/ [] Home Care/ [] Refused Recommended Disposition /[] [place] [facility]/ []  Follow-up with PCP Additional Notes: Patient reports his son was diagnosed with strep throat 10 days ago and placed on an antibiotic. He states that he is now feeling sick with a sore throat, cough, fatigue, and headache. He denies any fevers. Patient requesting an antibiotic for his symptoms. Offered patient earliest appointment today but he stated he wanted to be seen earlier than the available appointments and is going to urgent care.     Reason for Disposition  [1] Strep throat EXPOSURE within past 10 days AND [2] sore throat  Answer Assessment - Initial Assessment Questions 1. STREP EXPOSURE: "Was the exposure to someone who lives within your home?" If not, ask: "How much contact did you have with the sick person?"      Child has strep throat  2. ONSET: "How many days ago did the contact occur?"      10 days ago 3. PROVEN STREP: "Are you sure the person with strep had a positive throat culture or rapid strep test?"      Yes 4. STREP SYMPTOMS: "Do YOU have a sore throat, fever, or other symptoms suggestive of strep?"      Sore throat, fatigue  5. VIRAL SYMPTOMS: "Are there any symptoms of a cold, such as a runny nose, cough, hoarse voice?"     Cough  Protocols used: Strep Throat Exposure-A-AH

## 2024-04-24 ENCOUNTER — Other Ambulatory Visit: Payer: Self-pay | Admitting: Family Medicine

## 2024-04-25 ENCOUNTER — Other Ambulatory Visit: Payer: Self-pay | Admitting: Family Medicine

## 2024-05-20 ENCOUNTER — Other Ambulatory Visit: Payer: Self-pay | Admitting: Family Medicine

## 2024-06-24 ENCOUNTER — Ambulatory Visit: Payer: Self-pay | Admitting: Family Medicine

## 2024-06-24 ENCOUNTER — Ambulatory Visit: Admitting: Family Medicine

## 2024-06-24 VITALS — BP 118/70 | HR 88 | Resp 12 | Ht 69.0 in | Wt 207.0 lb

## 2024-06-24 DIAGNOSIS — E782 Mixed hyperlipidemia: Secondary | ICD-10-CM

## 2024-06-24 DIAGNOSIS — Z0000 Encounter for general adult medical examination without abnormal findings: Secondary | ICD-10-CM | POA: Diagnosis not present

## 2024-06-24 DIAGNOSIS — Z130 Encounter for screening for diseases of the blood and blood-forming organs and certain disorders involving the immune mechanism: Secondary | ICD-10-CM | POA: Diagnosis not present

## 2024-06-24 DIAGNOSIS — Z13228 Encounter for screening for other metabolic disorders: Secondary | ICD-10-CM

## 2024-06-24 DIAGNOSIS — Z1329 Encounter for screening for other suspected endocrine disorder: Secondary | ICD-10-CM | POA: Diagnosis not present

## 2024-06-24 LAB — COMPREHENSIVE METABOLIC PANEL WITH GFR
ALT: 38 U/L (ref 0–53)
AST: 21 U/L (ref 0–37)
Albumin: 4.7 g/dL (ref 3.5–5.2)
Alkaline Phosphatase: 43 U/L (ref 39–117)
BUN: 14 mg/dL (ref 6–23)
CO2: 27 meq/L (ref 19–32)
Calcium: 9.4 mg/dL (ref 8.4–10.5)
Chloride: 103 meq/L (ref 96–112)
Creatinine, Ser: 1.06 mg/dL (ref 0.40–1.50)
GFR: 91.46 mL/min (ref >60.00)
Glucose, Bld: 84 mg/dL (ref 70–99)
Potassium: 3.8 meq/L (ref 3.5–5.1)
Sodium: 140 meq/L (ref 135–145)
Total Bilirubin: 0.5 mg/dL (ref 0.2–1.2)
Total Protein: 7.2 g/dL (ref 6.0–8.3)

## 2024-06-24 LAB — LIPID PANEL
Cholesterol: 190 mg/dL (ref 0–200)
HDL: 46.6 mg/dL (ref >39.00)
LDL Cholesterol: 93 mg/dL (ref 0–99)
NonHDL: 143.2
Total CHOL/HDL Ratio: 4
Triglycerides: 253 mg/dL — ABNORMAL HIGH (ref 0.0–149.0)
VLDL: 50.6 mg/dL — ABNORMAL HIGH (ref 0.0–40.0)

## 2024-06-24 LAB — HEMOGLOBIN A1C: Hgb A1c MFr Bld: 5.5 % (ref 4.6–6.5)

## 2024-06-24 MED ORDER — LOVASTATIN 20 MG PO TABS
20.0000 mg | ORAL_TABLET | Freq: Every day | ORAL | 3 refills | Status: AC
Start: 2024-06-24 — End: ?

## 2024-06-24 NOTE — Progress Notes (Signed)
 HPI: [name] is a 33 y.o.male  with PMHx significant for OSA and hyperlipidemia here today for his routine physical examination.  Last CPE: 02/23/2023  Exercise: Walking his dog daily and light running/biking for 4x/month  Diet: Home cooking with vegetables daily. At times he eats fried foods he prepares for his children.  Sleep: 7-8 hours/night Smoking: No hx of tobacco use. Alcohol consumption: 3-6 beers over the weekends.  Dental: UTD with routine dental care. Vision: UTD with vision exams; wears contacts.   Health Maintenance  Topic Date Due   COVID-19 Vaccine (3 - Pfizer risk series) 07/10/2024*   Hepatitis B Vaccine (1 of 3 - 19+ 3-dose series) 06/24/2025*   HPV Vaccine (1 - Risk 3-dose SCDM series) 06/24/2025*   Flu Shot  07/08/2024   DTaP/Tdap/Td vaccine (2 - Td or Tdap) 07/09/2028   Hepatitis C Screening  Completed   HIV Screening  Completed   Meningitis B Vaccine  Aged Out  *Topic was postponed. The date shown is not the original due date.   Immunization History  Administered Date(s) Administered   Influenza, Quadrivalent, Recombinant, Inj, Pf 10/22/2019   Influenza,inj,Quad PF,6+ Mos 09/28/2017, 09/20/2021   Influenza-Unspecified 09/13/2018   PFIZER(Purple Top)SARS-COV-2 Vaccination 03/06/2020, 03/24/2020   Tdap 07/09/2018   Chronic medical problems:  OSA Pt has hx of mild OSA.  He has followed up with his sleep specialist in neurology. He was provided a mouth piece, which he uses nightly.  No acute concerns today.   Hyperlipidemia Currently on Lovastatin  20 mg once daily.  Good compliance and tolerance.  Pt requests a refill for his statin today.  He tries to limit his consumption of fried foods, but at times eats easy meals that he prepares for his children.  Lab Results  Component Value Date   CHOL 164 02/23/2023   HDL 48.20 02/23/2023   LDLCALC 94 02/23/2023   LDLDIRECT 162.0 09/27/2018   TRIG 107.0 02/23/2023   CHOLHDL 3  02/23/2023   Review of Systems  Constitutional:  Negative for activity change, appetite change and fever.  HENT:  Negative for nosebleeds, sore throat and trouble swallowing.   Eyes:  Negative for redness and visual disturbance.  Respiratory:  Negative for cough, shortness of breath and wheezing.   Cardiovascular:  Negative for chest pain, palpitations and leg swelling.  Gastrointestinal:  Negative for abdominal pain, blood in stool, nausea and vomiting.  Endocrine: Negative for cold intolerance, heat intolerance, polydipsia, polyphagia and polyuria.  Genitourinary:  Negative for decreased urine volume, dysuria, hematuria and testicular pain.  Musculoskeletal:  Negative for gait problem, joint swelling and myalgias.  Skin:  Negative for color change and rash.  Neurological:  Negative for syncope, weakness, numbness and headaches.  Hematological:  Negative for adenopathy. Does not bruise/bleed easily.  Psychiatric/Behavioral:  Negative for confusion. The patient is not nervous/anxious.   All other systems reviewed and are negative.  Current Outpatient Medications on File Prior to Visit  Medication Sig Dispense Refill   lovastatin  (MEVACOR ) 20 MG tablet Take 1 tablet (20 mg total) by mouth at bedtime. Due for follow up. 30 tablet 0   mometasone  (NASONEX ) 50 MCG/ACT nasal spray Place 2 sprays into the nose daily. 1 each 12   No current facility-administered medications on file prior to visit.    Past Medical History:  Diagnosis Date   Allergy    Hyperlipidemia     Past Surgical History:  Procedure Laterality Date   FUNCTIONAL ENDOSCOPIC SINUS SURGERY  2014   WISDOM TOOTH EXTRACTION     No Known Allergies  Family History  Problem Relation Age of Onset   Hypertension Mother    Social History   Socioeconomic History   Marital status: Married    Spouse name: Not on file   Number of children: Not on file   Years of education: Not on file   Highest education level: Bachelor's  degree (e.g., BA, AB, BS)  Occupational History   Not on file  Tobacco Use   Smoking status: Never   Smokeless tobacco: Never  Vaping Use   Vaping status: Never Used  Substance and Sexual Activity   Alcohol use: No    Comment: Social-mix of beer,wine, or liquor   Drug use: No   Sexual activity: Yes    Partners: Female  Other Topics Concern   Not on file  Social History Narrative   Not on file   Social Drivers of Health   Financial Resource Strain: Not on file  Food Insecurity: No Food Insecurity (06/24/2024)   Hunger Vital Sign    Worried About Running Out of Food in the Last Year: Never true    Ran Out of Food in the Last Year: Never true  Transportation Needs: Not on file  Physical Activity: Not on file  Stress: Stress Concern Present (06/24/2024)   [employer] of Occupational Health - Occupational Stress Questionnaire    Feeling of Stress: To some extent  Social Connections: Unknown (06/24/2024)   Social Connection and Isolation Panel    Frequency of Communication with Friends and Family: Twice a week    Frequency of Social Gatherings with Friends and Family: Twice a week    Attends Religious Services: Not on Marketing executive or Organizations: No    Attends Banker Meetings: Not on file    Marital Status: Married    Today's Vitals   06/24/24 1343  BP: 118/70  Pulse: 88  Resp: 12  SpO2: 99%  Weight: 207 lb (93.9 kg)  Height: 5' 9 (1.753 m)   Body mass index is 30.57 kg/m.  Wt Readings from Last 3 Encounters:  06/24/24 207 lb (93.9 kg)  10/12/23 200 lb (90.7 kg)  09/11/23 202 lb 6 oz (91.8 kg)   Physical Exam Vitals and nursing note reviewed.  Constitutional:      General: He is not in acute distress.    Appearance: He is well-developed.  HENT:     Head: Normocephalic and atraumatic.     Right Ear: External ear normal. Tympanic membrane is not erythematous.     Left Ear: Tympanic membrane, ear canal and external ear  normal.     Ears:     Comments: Cerumen excess in right ear canal, TM sen partially. Eyes:     Extraocular Movements: Extraocular movements intact.     Conjunctiva/sclera: Conjunctivae normal.     Pupils: Pupils are equal, round, and reactive to light.  Neck:     Thyroid : No thyroid  mass or thyromegaly.  Cardiovascular:     Rate and Rhythm: Normal rate and regular rhythm.     Pulses:          Dorsalis pedis pulses are 2+ on the right side and 2+ on the left side.     Heart sounds: No murmur heard. Pulmonary:     Effort: Pulmonary effort is normal. No respiratory distress.     Breath sounds: Normal breath sounds.  Abdominal:  Palpations: Abdomen is soft. There is no hepatomegaly or mass.     Tenderness: There is no abdominal tenderness.  Genitourinary:    Comments: No concerns. Musculoskeletal:        General: No tenderness.     Cervical back: Normal range of motion.     Comments: No major deformities appreciated and no signs of synovitis.  Lymphadenopathy:     Cervical: No cervical adenopathy.     Upper Body:     Right upper body: No supraclavicular adenopathy.     Left upper body: No supraclavicular adenopathy.  Skin:    General: Skin is warm.     Findings: No erythema.  Neurological:     General: No focal deficit present.     Mental Status: He is alert and oriented to person, place, and time.     Cranial Nerves: No cranial nerve deficit.     Sensory: No sensory deficit.     Gait: Gait normal.     Deep Tendon Reflexes:     Reflex Scores:      Bicep reflexes are 2+ on the right side and 2+ on the left side.      Patellar reflexes are 2+ on the right side and 2+ on the left side. Psychiatric:        Mood and Affect: Mood and affect normal.   ASSESSMENT AND PLAN: [name] was seen today for routine physical examination.  Orders Placed This Encounter  Procedures   Comprehensive metabolic panel with GFR   Hemoglobin A1c   Lipid panel   Lab Results   Component Value Date   CHOL 190 06/24/2024   HDL 46.60 06/24/2024   LDLCALC 93 06/24/2024   LDLDIRECT 162.0 09/27/2018   TRIG 253.0 (H) 06/24/2024   CHOLHDL 4 06/24/2024   Lab Results  Component Value Date   HGBA1C 5.5 06/24/2024   Lab Results  Component Value Date   NA 140 06/24/2024   CL 103 06/24/2024   K 3.8 06/24/2024   CO2 27 06/24/2024   BUN 14 06/24/2024   CREATININE 1.06 06/24/2024   GFR 91.46 06/24/2024   CALCIUM 9.4 06/24/2024   ALBUMIN 4.7 06/24/2024   GLUCOSE 84 06/24/2024   Lab Results  Component Value Date   ALT 38 06/24/2024   AST 21 06/24/2024   ALKPHOS 43 06/24/2024   BILITOT 0.5 06/24/2024   Routine general medical examination at a health care facility Assessment & Plan: We discussed the importance of regular physical activity and healthy diet for prevention of chronic illness and/or complications. Preventive guidelines reviewed. Vaccination up to date. Next CPE in a year.   Hyperlipidemia, mixed Assessment & Plan: Continue Lovastatin  20 mg daily and low fat diet. Further recommendations according to lipid panel result.  Orders: -     Comprehensive metabolic panel with GFR; Future -     Lipid panel; Future -     Lovastatin ; Take 1 tablet (20 mg total) by mouth at bedtime. Due for follow up.  Dispense: 90 tablet; Refill: 3  Screening for endocrine, metabolic and immunity disorder -     Comprehensive metabolic panel with GFR; Future -     Hemoglobin A1c; Future   Return in 1 year (on 06/24/2025) for CPE, Labs.  I, Vernell Forest, acting as a scribe for Erbie Arment [place], MD., have documented all relevant documentation on the behalf of Florella Mcneese [place], MD, as directed by   while in the presence of Jye Fariss [place], MD.  I, Jarelyn Bambach [place], MD, have reviewed all documentation for this visit. The documentation on 06/24/24 for the exam, diagnosis, procedures, and orders are all accurate and complete.  Zaara Sprowl G. [place], MD  [facility]. [place]  office.

## 2024-06-24 NOTE — Assessment & Plan Note (Signed)
 Continue Lovastatin  20 mg daily and low fat diet. Further recommendations according to lipid panel result.

## 2024-06-24 NOTE — Patient Instructions (Addendum)
 A few things to remember from today's visit:  Routine general medical examination at a health care facility  Hyperlipidemia, mixed - Plan: Comprehensive metabolic panel with GFR, Lipid panel, lovastatin  (MEVACOR ) 20 MG tablet  Screening for endocrine, metabolic and immunity disorder - Plan: Comprehensive metabolic panel with GFR, Hemoglobin A1c  If you need refills for medications you take chronically, please call your pharmacy. Do not use My Chart to request refills or for acute issues that need immediate attention. If you send a my chart message, it may take a few days to be addressed, specially if I am not in the office.  Please be sure medication list is accurate. If a new problem present, please set up appointment sooner than planned today.  Health Maintenance, Male Adopting a healthy lifestyle and getting preventive care are important in promoting health and wellness. Ask your health care provider about: The right schedule for you to have regular tests and exams. Things you can do on your own to prevent diseases and keep yourself healthy. What should I know about diet, weight, and exercise? Eat a healthy diet  Eat a diet that includes plenty of vegetables, fruits, low-fat dairy products, and lean protein. Do not eat a lot of foods that are high in solid fats, added sugars, or sodium. Maintain a healthy weight Body mass index (BMI) is a measurement that can be used to identify possible weight problems. It estimates body fat based on height and weight. Your health care provider can help determine your BMI and help you achieve or maintain a healthy weight. Get regular exercise Get regular exercise. This is one of the most important things you can do for your health. Most adults should: Exercise for at least 150 minutes each week. The exercise should increase your heart rate and make you sweat (moderate-intensity exercise). Do strengthening exercises at least twice a week. This is in  addition to the moderate-intensity exercise. Spend less time sitting. Even light physical activity can be beneficial. Watch cholesterol and blood lipids Have your blood tested for lipids and cholesterol at 35 years of age, then have this test every 5 years. You may need to have your cholesterol levels checked more often if: Your lipid or cholesterol levels are high. You are older than 35 years of age. You are at high risk for heart disease. What should I know about cancer screening? Many types of cancers can be detected early and may often be prevented. Depending on your health history and family history, you may need to have cancer screening at various ages. This may include screening for: Colorectal cancer. Prostate cancer. Skin cancer. Lung cancer. What should I know about heart disease, diabetes, and high blood pressure? Blood pressure and heart disease High blood pressure causes heart disease and increases the risk of stroke. This is more likely to develop in people who have high blood pressure readings or are overweight. Talk with your health care provider about your target blood pressure readings. Have your blood pressure checked: Every 3-5 years if you are 71-53 years of age. Every year if you are 55 years old or older. If you are between the ages of 52 and 57 and are a current or former smoker, ask your health care provider if you should have a one-time screening for abdominal aortic aneurysm (AAA). Diabetes Have regular diabetes screenings. This checks your fasting blood sugar level. Have the screening done: Once every three years after age 6 if you are at a normal weight  and have a low risk for diabetes. More often and at a younger age if you are overweight or have a high risk for diabetes. What should I know about preventing infection? Hepatitis B If you have a higher risk for hepatitis B, you should be screened for this virus. Talk with your health care provider to find out  if you are at risk for hepatitis B infection. Hepatitis C Blood testing is recommended for: Everyone born from [birth year] through 1967. Anyone with known risk factors for hepatitis C. Sexually transmitted infections (STIs) You should be screened each year for STIs, including gonorrhea and chlamydia, if: You are sexually active and are younger than 35 years of age. You are older than 35 years of age and your health care provider tells you that you are at risk for this type of infection. Your sexual activity has changed since you were last screened, and you are at increased risk for chlamydia or gonorrhea. Ask your health care provider if you are at risk. Ask your health care provider about whether you are at high risk for HIV. Your health care provider may recommend a prescription medicine to help prevent HIV infection. If you choose to take medicine to prevent HIV, you should first get tested for HIV. You should then be tested every 3 months for as long as you are taking the medicine. Follow these instructions at home: Alcohol use Do not drink alcohol if your health care provider tells you not to drink. If you drink alcohol: Limit how much you have to 0-2 drinks a day. Know how much alcohol is in your drink. In the [place] one drink equals one 12 oz bottle of beer (355 mL), one 5 oz glass of wine (148 mL), or one 1 oz glass of hard liquor (44 mL). Lifestyle Do not use any products that contain nicotine or tobacco. These products include cigarettes, chewing tobacco, and vaping devices, such as e-cigarettes. If you need help quitting, ask your health care provider. Do not use street drugs. Do not share needles. Ask your health care provider for help if you need support or information about quitting drugs. General instructions Schedule regular health, dental, and eye exams. Stay current with your vaccines. Tell your health care provider if: You often feel depressed. You have ever been abused or do  not feel safe at home. Summary Adopting a healthy lifestyle and getting preventive care are important in promoting health and wellness. Follow your health care provider's instructions about healthy diet, exercising, and getting tested or screened for diseases. Follow your health care provider's instructions on monitoring your cholesterol and blood pressure. This information is not intended to replace advice given to you by your health care provider. Make sure you discuss any questions you have with your health care provider. Document Revised: 04/15/2021 Document Reviewed: 04/15/2021 Elsevier Patient Education  2024 [employer].

## 2024-06-24 NOTE — Assessment & Plan Note (Signed)
We discussed the importance of regular physical activity and healthy diet for prevention of chronic illness and/or complications. Preventive guidelines reviewed. Vaccination up to date. Next CPE in a year.
